# Patient Record
Sex: Male | Born: 1970 | Race: White | Hispanic: No | Marital: Married | Smoking: Former smoker
Health system: Southern US, Community
[De-identification: ages and names within clinical notes are randomized; demographics above are authoritative.]

## PROBLEM LIST (undated history)

## (undated) DIAGNOSIS — E119 Type 2 diabetes mellitus without complications: Secondary | ICD-10-CM

## (undated) DIAGNOSIS — I10 Essential (primary) hypertension: Secondary | ICD-10-CM

## (undated) DIAGNOSIS — R319 Hematuria, unspecified: Secondary | ICD-10-CM

## (undated) HISTORY — DX: Hematuria, unspecified: R31.9

## (undated) HISTORY — DX: Type 2 diabetes mellitus without complications: E11.9

## (undated) HISTORY — DX: Essential (primary) hypertension: I10

## (undated) HISTORY — PX: VASECTOMY: SHX75

---

## 2010-06-26 ENCOUNTER — Emergency Department: Payer: Self-pay | Admitting: Emergency Medicine

## 2019-03-25 ENCOUNTER — Emergency Department
Admission: EM | Admit: 2019-03-25 | Discharge: 2019-03-25 | Disposition: A | Discharge status: Home / Self Care | Payer: Self-pay | Attending: Emergency Medicine | Admitting: Emergency Medicine

## 2019-03-25 ENCOUNTER — Emergency Department: Payer: Self-pay

## 2019-03-25 ENCOUNTER — Encounter: Payer: Self-pay | Admitting: Intensive Care

## 2019-03-25 ENCOUNTER — Other Ambulatory Visit: Payer: Self-pay

## 2019-03-25 DIAGNOSIS — Z87891 Personal history of nicotine dependence: Secondary | ICD-10-CM | POA: Insufficient documentation

## 2019-03-25 DIAGNOSIS — E119 Type 2 diabetes mellitus without complications: Secondary | ICD-10-CM | POA: Insufficient documentation

## 2019-03-25 DIAGNOSIS — R519 Headache, unspecified: Secondary | ICD-10-CM

## 2019-03-25 DIAGNOSIS — I10 Essential (primary) hypertension: Secondary | ICD-10-CM | POA: Insufficient documentation

## 2019-03-25 DIAGNOSIS — R51 Headache: Secondary | ICD-10-CM | POA: Insufficient documentation

## 2019-03-25 LAB — CBC WITH DIFFERENTIAL/PLATELET
Abs Immature Granulocytes: 0.02 10*3/uL (ref 0.00–0.07)
Basophils Absolute: 0.1 10*3/uL (ref 0.0–0.1)
Basophils Relative: 1 %
Eosinophils Absolute: 0.2 10*3/uL (ref 0.0–0.5)
Eosinophils Relative: 2 %
HCT: 47.6 % (ref 39.0–52.0)
Hemoglobin: 16.3 g/dL (ref 13.0–17.0)
Immature Granulocytes: 0 %
Lymphocytes Relative: 29 %
Lymphs Abs: 1.9 10*3/uL (ref 0.7–4.0)
MCH: 29.7 pg (ref 26.0–34.0)
MCHC: 34.2 g/dL (ref 30.0–36.0)
MCV: 86.9 fL (ref 80.0–100.0)
Monocytes Absolute: 0.6 10*3/uL (ref 0.1–1.0)
Monocytes Relative: 10 %
Neutro Abs: 3.7 10*3/uL (ref 1.7–7.7)
Neutrophils Relative %: 58 %
Platelets: 255 10*3/uL (ref 150–400)
RBC: 5.48 MIL/uL (ref 4.22–5.81)
RDW: 11.9 % (ref 11.5–15.5)
WBC: 6.4 10*3/uL (ref 4.0–10.5)
nRBC: 0 % (ref 0.0–0.2)

## 2019-03-25 LAB — COMPREHENSIVE METABOLIC PANEL
ALT: 22 U/L (ref 0–44)
AST: 20 U/L (ref 15–41)
Albumin: 4.2 g/dL (ref 3.5–5.0)
Alkaline Phosphatase: 88 U/L (ref 38–126)
Anion gap: 12 (ref 5–15)
BUN: 22 mg/dL — ABNORMAL HIGH (ref 6–20)
CO2: 23 mmol/L (ref 22–32)
Calcium: 9 mg/dL (ref 8.9–10.3)
Chloride: 100 mmol/L (ref 98–111)
Creatinine, Ser: 1.05 mg/dL (ref 0.61–1.24)
GFR calc Af Amer: >60 mL/min (ref >60)
GFR calc non Af Amer: >60 mL/min (ref >60)
Glucose, Bld: 199 mg/dL — ABNORMAL HIGH (ref 70–99)
Potassium: 3.2 mmol/L — ABNORMAL LOW (ref 3.5–5.1)
Sodium: 135 mmol/L (ref 135–145)
Total Bilirubin: 0.7 mg/dL (ref 0.3–1.2)
Total Protein: 7.2 g/dL (ref 6.5–8.1)

## 2019-03-25 MED ORDER — AMLODIPINE BESYLATE 5 MG PO TABS: 5.0000 mg | ORAL_TABLET | Freq: Every day | ORAL | 1 refills | Status: DC

## 2019-03-25 MED ORDER — ONDANSETRON 4 MG PO TBDP: 4.0000 mg | ORAL_TABLET | Freq: Three times a day (TID) | ORAL | 0 refills | Status: DC | PRN

## 2019-03-25 MED ORDER — METFORMIN HCL 500 MG PO TABS: 500.0000 mg | ORAL_TABLET | Freq: Two times a day (BID) | ORAL | 0 refills | Status: DC

## 2019-03-25 MED ORDER — IOHEXOL 350 MG/ML SOLN
75.0000 mL | Freq: Once | INTRAVENOUS | Status: AC | PRN
  Administered 2019-03-25: 75 mL via INTRAVENOUS

## 2019-03-25 NOTE — ED Provider Notes (Signed)
[facility] Emergency Department Provider Note  ____________________________________________  Time seen: Approximately 10:52 PM  I have reviewed the triage vital signs and the nursing notes.   HISTORY  Chief Complaint Migraine and Nausea    HPI ATARI NOVICK is a 48 y.o. male with no known past medical history who complains of a recurrent daily headache over the past 6 weeks.  Feels it behind the right eye radiating to the area of the right ear.  No aggravating or alleviating factors.  Has not noticed a pattern to timing or position or other activity.  Denies vision changes, hearing changes, paresthesias or weakness.  No trauma.  No fevers chills or neck pain.  Occurs approximately 3 times a day for which he takes Tylenol with some relief.    Of note patient has not eaten since breakfast, approximately 10 hours ago  History reviewed. No pertinent past medical history.   There are no active problems to display for this patient.    History reviewed. No pertinent surgical history.   Prior to Admission medications   Medication Sig Start Date End Date Taking? Authorizing Provider  amLODipine (NORVASC) 5 MG tablet Take 1 tablet (5 mg total) by mouth daily. 03/25/19   Sharman Cheek, MD  metFORMIN (GLUCOPHAGE) 500 MG tablet Take 1 tablet (500 mg total) by mouth 2 (two) times daily with a meal. 03/25/19   Sharman Cheek, MD  ondansetron (ZOFRAN ODT) 4 MG disintegrating tablet Take 1 tablet (4 mg total) by mouth every 8 (eight) hours as needed for nausea or vomiting. 03/25/19   Sharman Cheek, MD  Prescribed in ED   Allergies Patient has no known allergies.   History reviewed. No pertinent family history.  Social History Social History   Tobacco Use  . Smoking status: Former Smoker    Types: E-cigarettes, Cigarettes  . Smokeless tobacco: Never Used  Substance Use Topics  . Alcohol use: Yes  . Drug use: Never    Review of  Systems  Constitutional:   No fever or chills.  ENT:   No sore throat. No rhinorrhea. Cardiovascular:   No chest pain or syncope. Respiratory:   No dyspnea or cough. Gastrointestinal:   Negative for abdominal pain, vomiting and diarrhea.  Musculoskeletal:   Negative for focal pain or swelling All other systems reviewed and are negative except as documented above in ROS and HPI.  ____________________________________________   PHYSICAL EXAM:  VITAL SIGNS: ED Triage Vitals  Enc Vitals Group     BP 03/25/19 1738 (!) 169/110     Pulse Rate 03/25/19 1738 64     Resp 03/25/19 1738 18     Temp 03/25/19 1738 98.2 F (36.8 C)     Temp Source 03/25/19 1738 Oral     SpO2 03/25/19 1738 100 %     Weight 03/25/19 1739 185 lb (83.9 kg)     Height 03/25/19 1739 5\' 6"  (1.676 m)     Head Circumference --      Peak Flow --      Pain Score 03/25/19 1743 2     Pain Loc --      Pain Edu? --      Excl. in GC? --     Vital signs reviewed, nursing assessments reviewed.   Constitutional:   Alert and oriented. Non-toxic appearance. Eyes:   Conjunctivae are normal. EOMI. PERRL.  No APD, no nystagmus ENT      Head:   Normocephalic and atraumatic.  Nose:   Wearing a mask.      Mouth/Throat:   Wearing a mask.      Neck:   No meningismus. Full ROM. Hematological/Lymphatic/Immunilogical:   No cervical lymphadenopathy. Cardiovascular:   RRR. Symmetric bilateral radial and DP pulses.  No murmurs. Cap refill less than 2 seconds. Respiratory:   Normal respiratory effort without tachypnea/retractions. Breath sounds are clear and equal bilaterally. No wheezes/rales/rhonchi. Gastrointestinal:   Soft and nontender. Non distended. There is no CVA tenderness.  No rebound, rigidity, or guarding.  Musculoskeletal:   Normal range of motion in all extremities. No joint effusions.  No lower extremity tenderness.  No edema. Neurologic:   Normal speech and language.  Cranial nerves III through XII  intact Normal gait Normal cerebellar function Motor grossly intact. No acute focal neurologic deficits are appreciated.  Skin:    Skin is warm, dry and intact. No rash noted.  No petechiae, purpura, or bullae.  ____________________________________________    LABS (pertinent positives/negatives) (all labs ordered are listed, but only abnormal results are displayed) Labs Reviewed  COMPREHENSIVE METABOLIC PANEL - Abnormal; Notable for the following components:      Result Value   Potassium 3.2 (*)    Glucose, Bld 199 (*)    BUN 22 (*)    All other components within normal limits  CBC WITH DIFFERENTIAL/PLATELET  HEMOGLOBIN A1C   ____________________________________________   EKG    ____________________________________________    RADIOLOGY  Ct Angio Head W Or Wo Contrast  Result Date: 03/25/2019 CLINICAL DATA:  Headache for the last 6 weeks. EXAM: CT ANGIOGRAPHY HEAD TECHNIQUE: Multidetector CT imaging of the head was performed using the standard protocol during bolus administration of intravenous contrast. Multiplanar CT image reconstructions and MIPs were obtained to evaluate the vascular anatomy. CONTRAST:  6mL OMNIPAQUE IOHEXOL 350 MG/ML SOLN COMPARISON:  None. FINDINGS: CT HEAD Brain: The brain shows a normal appearance without evidence of malformation, atrophy, old or acute small or large vessel infarction, mass lesion, hemorrhage, hydrocephalus or extra-axial collection. Vascular: No hyperdense vessel. No evidence of atherosclerotic calcification. Skull: Normal.  No traumatic finding.  No focal bone lesion. Sinuses/Orbits: Patient does have some inflammatory change of the ethmoid and frontal sinuses right more than left. This is not advanced. Orbits negative. Other: None significant CTA HEAD Anterior circulation: Both internal carotid arteries widely patent through the skull base and siphon regions. The anterior and middle cerebral vessels are normal without aneurysm, stenosis  or vascular malformation. Posterior circulation: Both vertebral arteries are patent through the foramen magnum to the basilar. No basilar stenosis. Posterior circulation branch vessels are normal. PCAs receive most of there supply from the anterior circulation. Venous sinuses: Patent and normal. Anatomic variants: None significant. IMPRESSION: Normal head CT. Normal intracranial CT angiography. No abnormality seen to explain headache. Electronically Signed   By: Nelson Chimes M.D.   On: 03/25/2019 20:37    ____________________________________________   PROCEDURES Procedures  ____________________________________________  DIFFERENTIAL DIAGNOSIS   Intracranial tumor, cerebral aneurysm, chronic daily headache, secondary headache due to hypertension  CLINICAL IMPRESSION / ASSESSMENT AND PLAN / ED COURSE  Medications ordered in the ED: Medications  iohexol (OMNIPAQUE) 350 MG/ML injection 75 mL (75 mLs Intravenous Contrast Given 03/25/19 2023)    Pertinent labs & imaging results that were available during my care of the patient were reviewed by me and considered in my medical decision making (see chart for details).  KAREE CHRISTOPHERSON was evaluated in Emergency Department on 03/25/2019 for the symptoms  described in the history of present illness. He was evaluated in the context of the global COVID-19 pandemic, which necessitated consideration that the patient might be at risk for infection with the SARS-CoV-2 virus that causes COVID-19. Institutional protocols and algorithms that pertain to the evaluation of patients at risk for COVID-19 are in a state of rapid change based on information released by regulatory bodies including the CDC and federal and state organizations. These policies and algorithms were followed during the patient's care in the ED.   Patient presents with chronic daily headache, unusual that it is unilateral.  Due to the chronicity, will get imaging out of concern for tumor or  aneurysm.  Clinical Course as of Mar 25 2251  Fri Mar 25, 2019  2057 CT angiogram of the head is normal, no findings to explain the patient's symptoms.  May be related to medication overuse.  Will refer to neurology.   [PS]    Clinical Course User Index [PS] Sharman CheekStafford, Eyvette Cordon, MD    ----------------------------------------- 10:54 PM on 03/25/2019 -----------------------------------------  Persistently elevated blood pressure in the ED, maintaining a level of about 170/100.  I will start him on a low-dose amlodipine for this.  Also with his fasting glucose level of 199 today, this is diagnostic of a new onset of diabetes and I will start him on metformin.  Recommend close follow-up with primary care in about a week.  Prescriptions for amlodipine and metformin.   ____________________________________________   FINAL CLINICAL IMPRESSION(S) / ED DIAGNOSES    Final diagnoses:  New onset type 2 diabetes mellitus (HCC)  Hypertension, unspecified type  Chronic daily headache     ED Discharge Orders         Ordered    metFORMIN (GLUCOPHAGE) 500 MG tablet  2 times daily with meals     03/25/19 2251    amLODipine (NORVASC) 5 MG tablet  Daily     03/25/19 2251    ondansetron (ZOFRAN ODT) 4 MG disintegrating tablet  Every 8 hours PRN     03/25/19 2251          Portions of this note were generated with dragon dictation software. Dictation errors may occur despite best attempts at proofreading.   Sharman CheekStafford, Parry Po, MD 03/25/19 2255

## 2019-03-25 NOTE — ED Notes (Signed)
Called lab and spoke with Murray Hodgkins and she will add on A1C to blood already in lab

## 2019-03-25 NOTE — ED Triage Notes (Addendum)
Patient reports he has had migraine X 6 weeks with nausea. [facility] sent here for CT of head. Denies HX migraines. Reports migraine is not tolerable without tylenol. Denies blurry vision

## 2019-03-25 NOTE — ED Notes (Signed)
First Nurse Note: Pt states that he was seen at [facility] and was told that he needed to have a CT scan. Pt is c/o that goes from his right eye all the way back to his right ear. Pt states that he has been on antibiotics for 3 days.

## 2019-03-25 NOTE — ED Notes (Signed)
Patient transported to CT 

## 2019-03-28 LAB — HEMOGLOBIN A1C
Hgb A1c MFr Bld: 7.5 % — ABNORMAL HIGH (ref 4.8–5.6)
Mean Plasma Glucose: 169 mg/dL

## 2019-07-04 ENCOUNTER — Ambulatory Visit: Payer: Self-pay | Attending: Internal Medicine

## 2019-07-04 DIAGNOSIS — Z20822 Contact with and (suspected) exposure to covid-19: Secondary | ICD-10-CM

## 2019-07-04 DIAGNOSIS — U071 COVID-19: Secondary | ICD-10-CM | POA: Insufficient documentation

## 2019-07-06 LAB — NOVEL CORONAVIRUS, NAA: SARS-CoV-2, NAA: DETECTED — AB

## 2019-09-24 ENCOUNTER — Emergency Department: Payer: Self-pay

## 2019-09-24 ENCOUNTER — Other Ambulatory Visit: Payer: Self-pay

## 2019-09-24 ENCOUNTER — Emergency Department
Admission: EM | Admit: 2019-09-24 | Discharge: 2019-09-24 | Disposition: A | Discharge status: Home / Self Care | Payer: Self-pay | Attending: Student | Admitting: Student

## 2019-09-24 DIAGNOSIS — K047 Periapical abscess without sinus: Secondary | ICD-10-CM | POA: Insufficient documentation

## 2019-09-24 DIAGNOSIS — Z79899 Other long term (current) drug therapy: Secondary | ICD-10-CM | POA: Insufficient documentation

## 2019-09-24 DIAGNOSIS — L03211 Cellulitis of face: Secondary | ICD-10-CM | POA: Insufficient documentation

## 2019-09-24 DIAGNOSIS — Z87891 Personal history of nicotine dependence: Secondary | ICD-10-CM | POA: Insufficient documentation

## 2019-09-24 LAB — CBC WITH DIFFERENTIAL/PLATELET
Abs Immature Granulocytes: 0.03 10*3/uL (ref 0.00–0.07)
Basophils Absolute: 0.1 10*3/uL (ref 0.0–0.1)
Basophils Relative: 1 %
Eosinophils Absolute: 0.1 10*3/uL (ref 0.0–0.5)
Eosinophils Relative: 1 %
HCT: 49.5 % (ref 39.0–52.0)
Hemoglobin: 16.7 g/dL (ref 13.0–17.0)
Immature Granulocytes: 0 %
Lymphocytes Relative: 21 %
Lymphs Abs: 1.8 10*3/uL (ref 0.7–4.0)
MCH: 29.6 pg (ref 26.0–34.0)
MCHC: 33.7 g/dL (ref 30.0–36.0)
MCV: 87.8 fL (ref 80.0–100.0)
Monocytes Absolute: 1 10*3/uL (ref 0.1–1.0)
Monocytes Relative: 12 %
Neutro Abs: 5.5 10*3/uL (ref 1.7–7.7)
Neutrophils Relative %: 65 %
Platelets: 245 10*3/uL (ref 150–400)
RBC: 5.64 MIL/uL (ref 4.22–5.81)
RDW: 12 % (ref 11.5–15.5)
WBC: 8.5 10*3/uL (ref 4.0–10.5)
nRBC: 0 % (ref 0.0–0.2)

## 2019-09-24 LAB — COMPREHENSIVE METABOLIC PANEL
ALT: 26 U/L (ref 0–44)
AST: 24 U/L (ref 15–41)
Albumin: 4.4 g/dL (ref 3.5–5.0)
Alkaline Phosphatase: 126 U/L (ref 38–126)
Anion gap: 9 (ref 5–15)
BUN: 16 mg/dL (ref 6–20)
CO2: 27 mmol/L (ref 22–32)
Calcium: 9.1 mg/dL (ref 8.9–10.3)
Chloride: 104 mmol/L (ref 98–111)
Creatinine, Ser: 1.12 mg/dL (ref 0.61–1.24)
GFR calc Af Amer: >60 mL/min (ref >60)
GFR calc non Af Amer: >60 mL/min (ref >60)
Glucose, Bld: 129 mg/dL — ABNORMAL HIGH (ref 70–99)
Potassium: 4.4 mmol/L (ref 3.5–5.1)
Sodium: 140 mmol/L (ref 135–145)
Total Bilirubin: 0.7 mg/dL (ref 0.3–1.2)
Total Protein: 7.7 g/dL (ref 6.5–8.1)

## 2019-09-24 LAB — TROPONIN I (HIGH SENSITIVITY): Troponin I (High Sensitivity): 6 ng/L (ref <18)

## 2019-09-24 LAB — LACTIC ACID, PLASMA: Lactic Acid, Venous: 1.7 mmol/L (ref 0.5–1.9)

## 2019-09-24 IMAGING — CT CT NECK W/ CM
3 of 4 series · 11 of 33 positions shown, 13 images · IV contrast (omnipaque)
Comparison: None.

CLINICAL DATA: Right-sided ear, throat, and submandibular pain with
palpable abnormality in the right submandibular region.

EXAM:
CT NECK WITH CONTRAST
TECHNIQUE: Multidetector CT imaging of the neck was performed using the
standard protocol following the bolus administration of intravenous
contrast.
CONTRAST:  75mL OMNIPAQUE IOHEXOL 300 MG/ML  SOLN

[Series 5: sag neck · sagittal · 0.43mm/px · 5 of 87 slices shown, 6 images]
[im 29/87  bone]
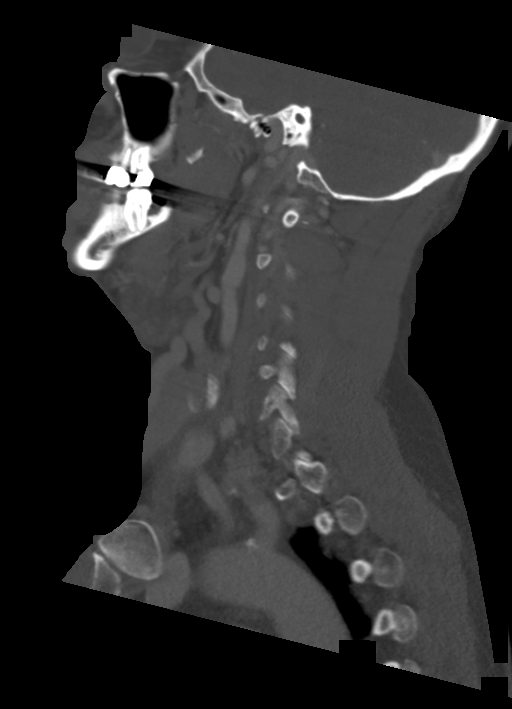
[im 36/87  bone]
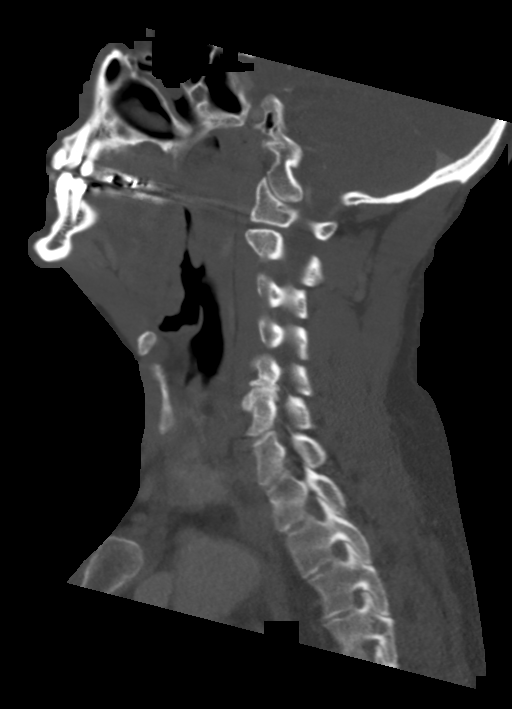
[im 44/87  soft-tissue]
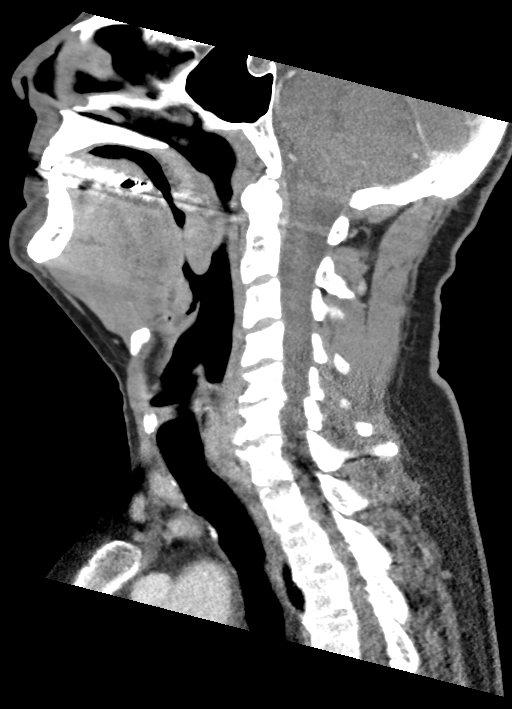
[im 44/87  bone]
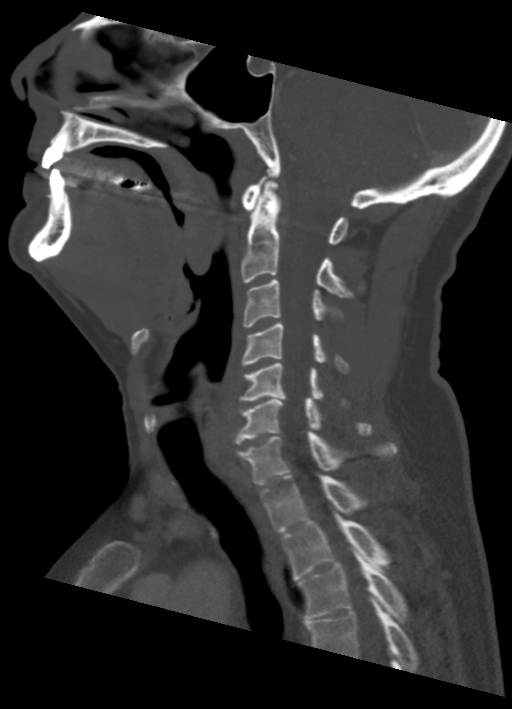
[im 51/87  bone]
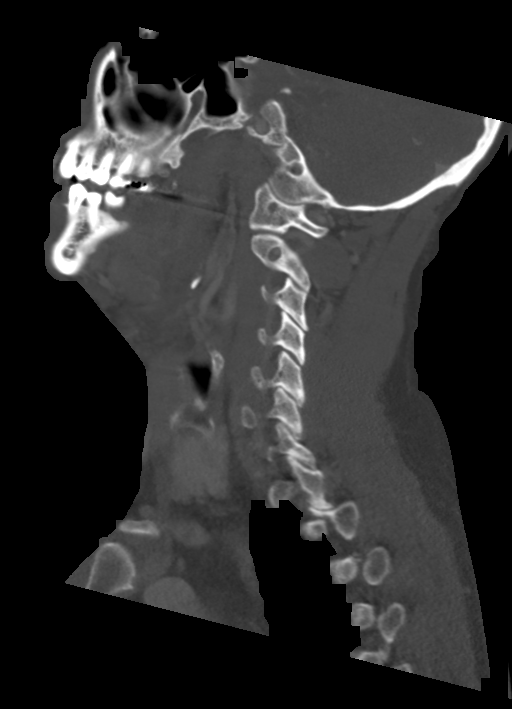
[im 58/87  bone]
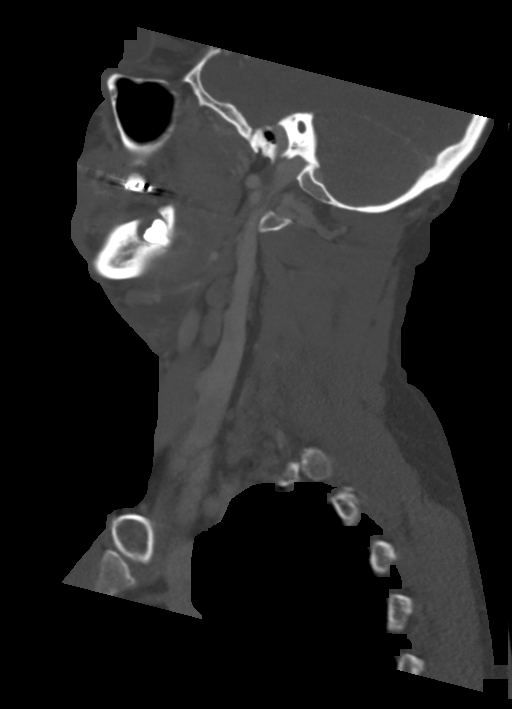

[Series 6: cor neck · coronal · 0.34mm/px · 3 of 109 slices shown]
[im 25/109  bone]
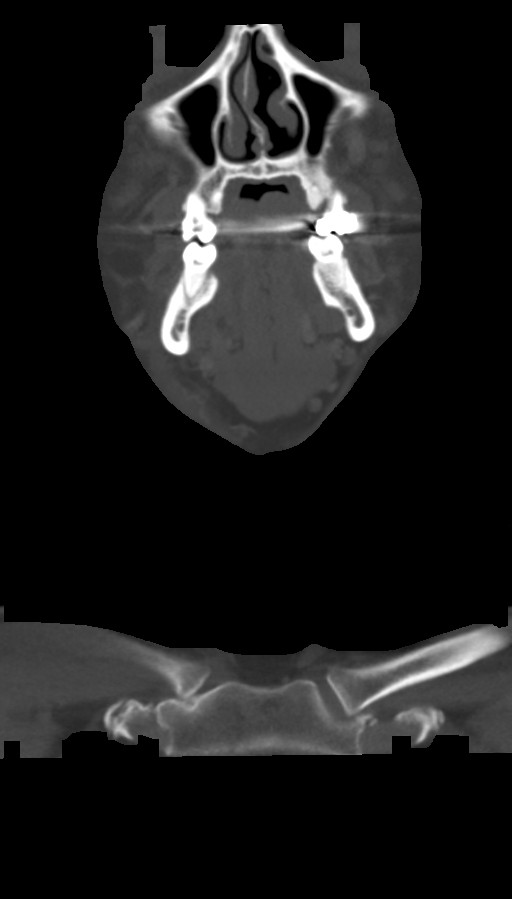
[im 45/109  bone]
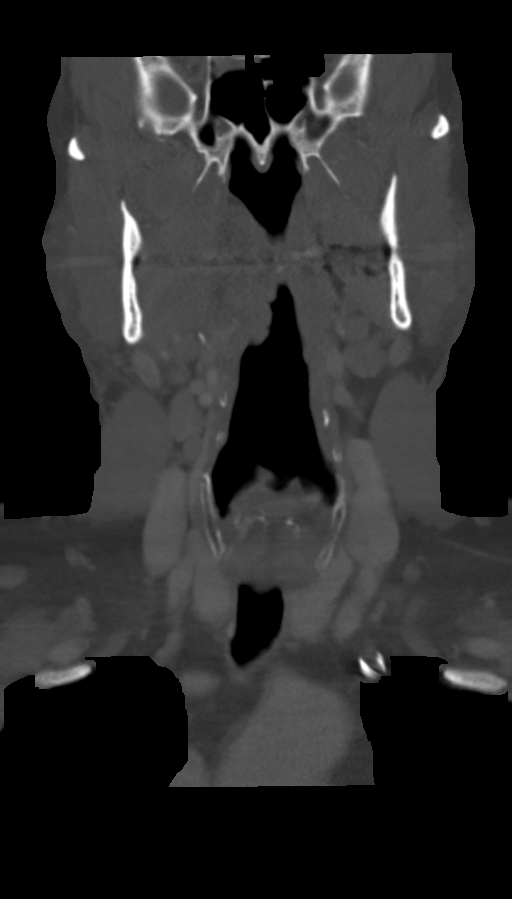
[im 64/109  bone]
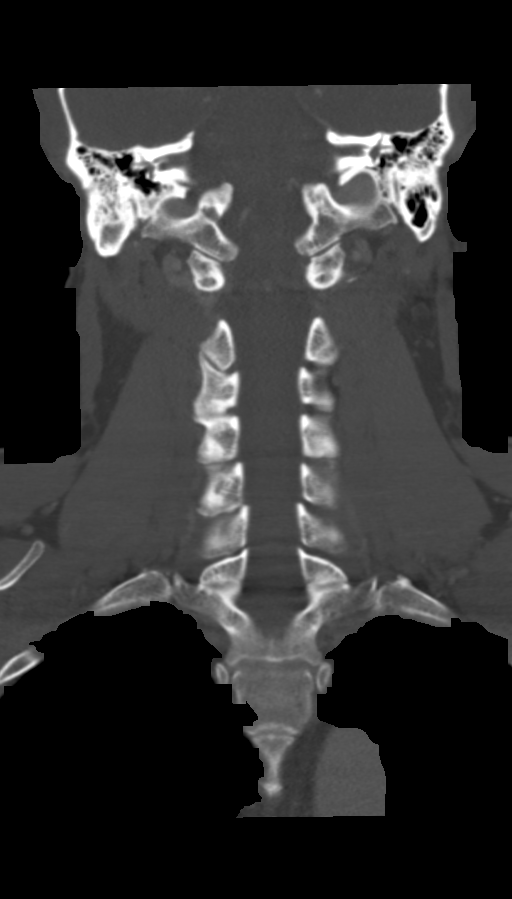

[Series 7: orthogonal ax · axial · 0.34mm/px · z∈[-303,-139]mm · 3 of 150 slices shown, 4 images]
[im 43/150  soft-tissue]
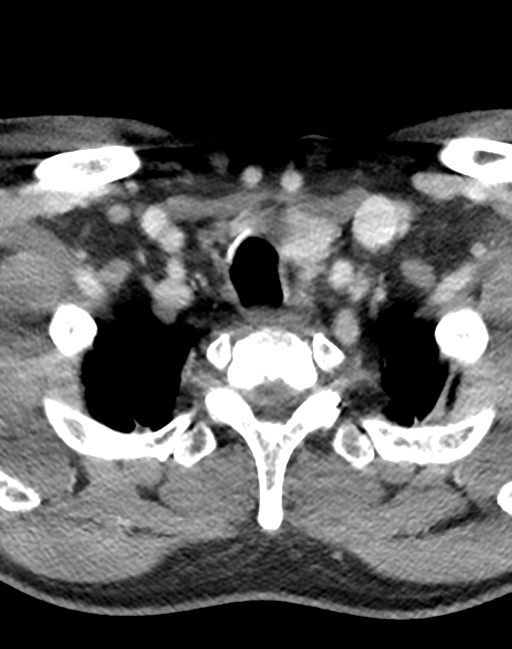
[im 43/150  bone]
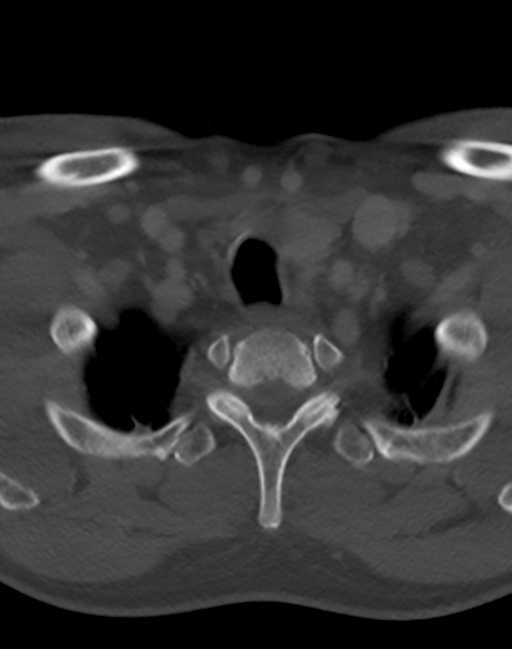
[im 86/150  bone]
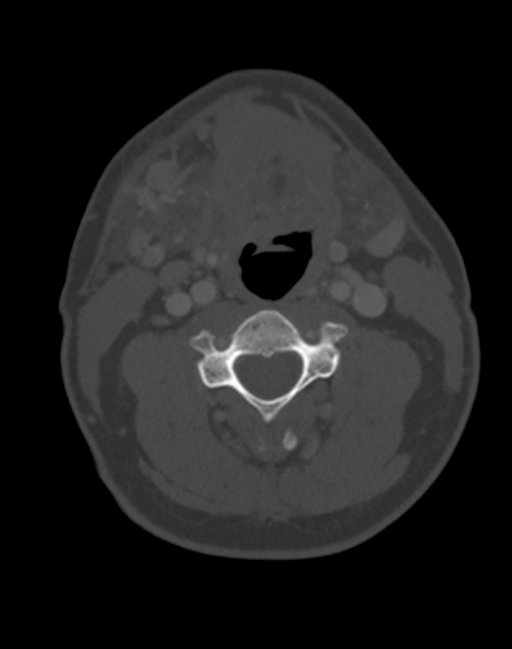
[im 128/150  bone]
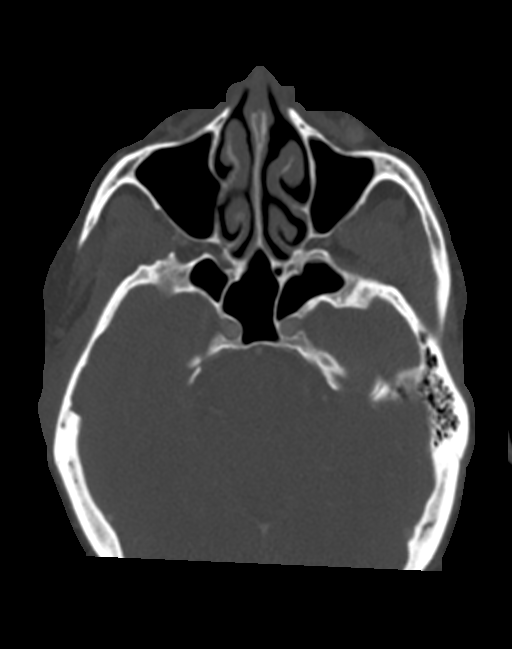

[11 of 33 positions shown; findings below may reference images not displayed]

FINDINGS: Pharynx and larynx: Mild right parapharyngeal space edema with mild
external mass effect on the right oropharynx due to the inflammatory
process described below. No significant airway narrowing. No
retropharyngeal fluid collection.

Salivary glands: There are inflammatory changes primarily
posteriorly in the right submandibular space with soft tissue
thickening extending anteriorly and inferiorly along the platysma as
well as inflammation extending posteriorly and superiorly into the
parapharyngeal space. Inflammation also involves the masticator
space with edema of the medial pterygoid muscle and to a lesser
extent masseter muscle. No organized fluid collection is identified.
There is fatty infiltration of both submandibular glands, and the
right submandibular gland does not clearly appear to be the
epicenter of the aforementioned inflammatory process. The parotid
glands are unremarkable. No salivary stone or ductal dilatation is
identified.

Thyroid: Unremarkable.

Lymph nodes: Mildly prominent subcentimeter short axis lymph nodes
on the right levels IB and IIA, likely reactive.

Vascular: Major vascular structures of the neck are patent.

Limited intracranial: Unremarkable.

Visualized orbits: Unremarkable.

Mastoids and visualized paranasal sinuses: Mild scattered mucosal
thickening in the visualized paranasal sinuses. No significant
mastoid fluid.

Skeleton: Dental disease with scattered periapical lucencies. More
extensive lucency surrounding the unerupted right mandibular third
maxillary molar with overlying cortical disruption and with the
greatest amount of the above described soft tissue inflammation
located in this region. Mild cervical spondylosis.

Upper chest: Clear lung apices.

Other: None.
IMPRESSION: Inflammation in the right submandibular and masticator spaces likely
reflecting cellulitis and favored to be dental in origin arising
from the right third mandibular molar tooth. No drainable abscess.

## 2019-09-24 MED ORDER — CLINDAMYCIN HCL 300 MG PO CAPS: 300.0000 mg | ORAL_CAPSULE | Freq: Four times a day (QID) | ORAL | 0 refills | Status: DC

## 2019-09-24 MED ORDER — IOHEXOL 300 MG/ML  SOLN
75.0000 mL | Freq: Once | INTRAMUSCULAR | Status: AC | PRN
  Administered 2019-09-24: 17:00:00 75 mL via INTRAVENOUS

## 2019-09-24 MED ORDER — HYDROCODONE-ACETAMINOPHEN 5-325 MG PO TABS: 1.0000 | ORAL_TABLET | ORAL | 0 refills | Status: DC | PRN

## 2019-09-24 MED ORDER — CLINDAMYCIN HCL 150 MG PO CAPS
300.0000 mg | ORAL_CAPSULE | Freq: Once | ORAL | Status: AC
  Administered 2019-09-24: 300 mg via ORAL
  Filled 2019-09-24: qty 2

## 2019-09-24 MED ORDER — OXYCODONE-ACETAMINOPHEN 5-325 MG PO TABS: 1.0000 | ORAL_TABLET | Freq: Once | ORAL | Status: DC

## 2019-09-24 NOTE — ED Triage Notes (Signed)
Pt states that pt comes for ear pain and not able to open mouth/jaw pain for 2 days. Seen at [facility] and didn't dx pt with anything. Sent pt here for more work up. Pt was hypertensive at [facility]. Pt states it also hurts to swallow.

## 2019-09-24 NOTE — ED Notes (Signed)
Patient resting on stretcher, warm blanket given. NAD.

## 2019-09-24 NOTE — ED Provider Notes (Signed)
[facility] Emergency Department Provider Note  ____________________________________________  Time seen: Approximately 5:33 PM  I have reviewed the triage vital signs and the nursing notes.   HISTORY  Chief Complaint Otalgia    HPI RIGGS DINEEN is a 49 y.o. male who presents the emergency department complaining of pain to the right ear, right jaw, right throat.  Patient states that pain initiated around the right submandibular region at the angle of the jaw, has now encompassed both his ear and his throat.  Patient states that he has no difficulty swallowing but he does have difficulty opening his mouth due to the pain.  Patient denies any fevers or chills, nasal congestion.  Patient denies any difficulty breathing.  No cough or shortness of breath.  No abdominal pain, nausea vomiting or diarrhea.  Patient denies any trauma to the face.  No history of same in the past.  Patient has been using over-the-counter medications with no relief.         History reviewed. No pertinent past medical history.  There are no problems to display for this patient.   History reviewed. No pertinent surgical history.  Prior to Admission medications   Medication Sig Start Date End Date Taking? Authorizing Provider  amLODipine (NORVASC) 5 MG tablet Take 1 tablet (5 mg total) by mouth daily. 03/25/19   Sharman Cheek, MD  clindamycin (CLEOCIN) 300 MG capsule Take 1 capsule (300 mg total) by mouth 4 (four) times daily. 09/24/19   Cuthriell, Delorise Royals, PA-C  HYDROcodone-acetaminophen (NORCO/VICODIN) 5-325 MG tablet Take 1 tablet by mouth every 4 (four) hours as needed for moderate pain. 09/24/19   Cuthriell, Delorise Royals, PA-C  metFORMIN (GLUCOPHAGE) 500 MG tablet Take 1 tablet (500 mg total) by mouth 2 (two) times daily with a meal. 03/25/19   Sharman Cheek, MD  ondansetron (ZOFRAN ODT) 4 MG disintegrating tablet Take 1 tablet (4 mg total) by mouth every 8 (eight) hours as needed  for nausea or vomiting. 03/25/19   Sharman Cheek, MD    Allergies Patient has no known allergies.  History reviewed. No pertinent family history.  Social History Social History   Tobacco Use  . Smoking status: Former Smoker    Types: E-cigarettes, Cigarettes  . Smokeless tobacco: Never Used  Substance Use Topics  . Alcohol use: Yes  . Drug use: Never     Review of Systems  Constitutional: No fever/chills Eyes: No visual changes. No discharge ENT: Positive for right ear, right jaw, right throat pain Cardiovascular: no chest pain. Respiratory: no cough. No SOB. Gastrointestinal: No abdominal pain.  No nausea, no vomiting.  No diarrhea.  No constipation. Musculoskeletal: Negative for musculoskeletal pain. Skin: Negative for rash, abrasions, lacerations, ecchymosis. Neurological: Negative for headaches, focal weakness or numbness. 10-point ROS otherwise negative.  ____________________________________________   PHYSICAL EXAM:  VITAL SIGNS: ED Triage Vitals  Enc Vitals Group     BP 09/24/19 1531 (!) 199/106     Pulse Rate 09/24/19 1531 62     Resp 09/24/19 1531 18     Temp 09/24/19 1531 98.4 F (36.9 C)     Temp Source 09/24/19 1531 Oral     SpO2 09/24/19 1531 99 %     Weight 09/24/19 1533 183 lb (83 kg)     Height 09/24/19 1533 5\' 6"  (1.676 m)     Head Circumference --      Peak Flow --      Pain Score 09/24/19 1533 8  Pain Loc --      Pain Edu? --      Excl. in GC? --      Constitutional: Alert and oriented. Well appearing and in no acute distress. Eyes: Conjunctivae are normal. PERRL. EOMI. Head: Atraumatic. ENT:      Ears: EACs and TMs unremarkable bilaterally.        Nose: No congestion/rhinnorhea.      Mouth/Throat: Visualization of the external jaw reveals mild edema in the submandibular region around the angle of the jaw.  No extension into the submental region.  No erythema or edema of the anterior neck.  Mucous membranes are moist. Patient  has mild trismus due to pain, what is visualized of the oropharynx reveals no erythema or edema.  Tonsils are not well visualized bilaterally.  Palpation along the right submandibular region at the angle of the jaw reveals tenderness, palpable finding concerning for sialoadenitis.  No significant tenderness over the parotid gland.  No submental tenderness.  No evidence of lymphadenopathy on palpation along the anterior cervical chain.  No gross erythema or edema of the anterior neck. Neck: No stridor.  No cervical spine tenderness to palpation. Hematological/Lymphatic/Immunilogical: No cervical lymphadenopathy. Cardiovascular: Normal rate, regular rhythm. Normal S1 and S2.  Good peripheral circulation. Respiratory: Normal respiratory effort without tachypnea or retractions. Lungs CTAB. Good air entry to the bases with no decreased or absent breath sounds. Musculoskeletal: Full range of motion to all extremities. No gross deformities appreciated. Neurologic:  Normal speech and language. No gross focal neurologic deficits are appreciated.  Skin:  Skin is warm, dry and intact. No rash noted. Psychiatric: Mood and affect are normal. Speech and behavior are normal. Patient exhibits appropriate insight and judgement.   ____________________________________________   LABS (all labs ordered are listed, but only abnormal results are displayed)  Labs Reviewed  COMPREHENSIVE METABOLIC PANEL - Abnormal; Notable for the following components:      Result Value   Glucose, Bld 129 (*)    All other components within normal limits  LACTIC ACID, PLASMA  CBC WITH DIFFERENTIAL/PLATELET  TROPONIN I (HIGH SENSITIVITY)   ____________________________________________  EKG   ____________________________________________  RADIOLOGY I personally viewed and evaluated these images as part of my medical decision making, as well as reviewing the written report by the radiologist.  CT Soft Tissue Neck W  Contrast  Result Date: 09/24/2019 CLINICAL DATA:  Right-sided ear, throat, and submandibular pain with palpable abnormality in the right submandibular region. EXAM: CT NECK WITH CONTRAST TECHNIQUE: Multidetector CT imaging of the neck was performed using the standard protocol following the bolus administration of intravenous contrast. CONTRAST:  3mL OMNIPAQUE IOHEXOL 300 MG/ML  SOLN COMPARISON:  None. FINDINGS: Pharynx and larynx: Mild right parapharyngeal space edema with mild external mass effect on the right oropharynx due to the inflammatory process described below. No significant airway narrowing. No retropharyngeal fluid collection. Salivary glands: There are inflammatory changes primarily posteriorly in the right submandibular space with soft tissue thickening extending anteriorly and inferiorly along the platysma as well as inflammation extending posteriorly and superiorly into the parapharyngeal space. Inflammation also involves the masticator space with edema of the medial pterygoid muscle and to a lesser extent masseter muscle. No organized fluid collection is identified. There is fatty infiltration of both submandibular glands, and the right submandibular gland does not clearly appear to be the epicenter of the aforementioned inflammatory process. The parotid glands are unremarkable. No salivary stone or ductal dilatation is identified. Thyroid: Unremarkable. Lymph nodes: Mildly  prominent subcentimeter short axis lymph nodes on the right levels IB and IIA, likely reactive. Vascular: Major vascular structures of the neck are patent. Limited intracranial: Unremarkable. Visualized orbits: Unremarkable. Mastoids and visualized paranasal sinuses: Mild scattered mucosal thickening in the visualized paranasal sinuses. No significant mastoid fluid. Skeleton: Dental disease with scattered periapical lucencies. More extensive lucency surrounding the unerupted right mandibular third maxillary molar with  overlying cortical disruption and with the greatest amount of the above described soft tissue inflammation located in this region. Mild cervical spondylosis. Upper chest: Clear lung apices. Other: None. IMPRESSION: Inflammation in the right submandibular and masticator spaces likely reflecting cellulitis and favored to be dental in origin arising from the right third mandibular molar tooth. No drainable abscess. Electronically Signed   By: Logan Bores M.D.   On: 09/24/2019 17:29    ____________________________________________    PROCEDURES  Procedure(s) performed:    Procedures    Medications  clindamycin (CLEOCIN) capsule 300 mg (has no administration in time range)  oxyCODONE-acetaminophen (PERCOCET/ROXICET) 5-325 MG per tablet 1 tablet (has no administration in time range)  iohexol (OMNIPAQUE) 300 MG/ML solution 75 mL (75 mLs Intravenous Contrast Given 09/24/19 1657)     ____________________________________________   INITIAL IMPRESSION / ASSESSMENT AND PLAN / ED COURSE  Pertinent labs & imaging results that were available during my care of the patient were reviewed by me and considered in my medical decision making (see chart for details).  Review of the [place] CSRS was performed in accordance of the [place] prior to dispensing any controlled drugs.           Patient's diagnosis is consistent with dental infection with facial cellulitis.  Patient presented to the emergency department with pain along the right jaw extending into the ear and throat.  Visualization revealed no significant oropharyngeal erythema or edema but patient did have some trismus due to pain.  Findings on physical exam are concerning for possible region of inflammation and tenderness in the right submandibular region.  Differential included parotiditis, abscess, facial cellulitis, sialoadenitis, Ludwick's angina, Lemierre's.  Patient had labs, CT scan.  Findings were consistent with facial cellulitis originating  in a dental infection.  Patient will be placed on clindamycin, limited pain medication.  Follow-up with primary care dentist as needed.. Patient is given ED precautions to return to the ED for any worsening or new symptoms.     ____________________________________________  FINAL CLINICAL IMPRESSION(S) / ED DIAGNOSES  Final diagnoses:  Facial cellulitis  Dental infection      NEW MEDICATIONS STARTED DURING THIS VISIT:  ED Discharge Orders         Ordered    clindamycin (CLEOCIN) 300 MG capsule  4 times daily     09/24/19 1827    HYDROcodone-acetaminophen (NORCO/VICODIN) 5-325 MG tablet  Every 4 hours PRN     09/24/19 1827              This chart was dictated using voice recognition software/Dragon. Despite best efforts to proofread, errors can occur which can change the meaning. Any change was purely unintentional.    Darletta Moll, PA-C 09/24/19 1828    Lilia Pro., MD 09/25/19 [phone]

## 2019-11-18 ENCOUNTER — Other Ambulatory Visit: Payer: Self-pay

## 2019-11-21 ENCOUNTER — Telehealth: Payer: Self-pay | Admitting: Adult Health

## 2019-11-21 ENCOUNTER — Ambulatory Visit (INDEPENDENT_AMBULATORY_CARE_PROVIDER_SITE_OTHER): Payer: 59 | Admitting: Nurse Practitioner

## 2019-11-21 ENCOUNTER — Encounter: Payer: Self-pay | Admitting: Nurse Practitioner

## 2019-11-21 ENCOUNTER — Other Ambulatory Visit: Payer: Self-pay

## 2019-11-21 VITALS — BP 138/100 | HR 74 | Temp 97.4°F | Ht 66.0 in | Wt 177.0 lb

## 2019-11-21 DIAGNOSIS — Z0000 Encounter for general adult medical examination without abnormal findings: Secondary | ICD-10-CM

## 2019-11-21 DIAGNOSIS — M954 Acquired deformity of chest and rib: Secondary | ICD-10-CM | POA: Diagnosis not present

## 2019-11-21 DIAGNOSIS — R3121 Asymptomatic microscopic hematuria: Secondary | ICD-10-CM | POA: Diagnosis not present

## 2019-11-21 DIAGNOSIS — I10 Essential (primary) hypertension: Secondary | ICD-10-CM | POA: Diagnosis not present

## 2019-11-21 DIAGNOSIS — E119 Type 2 diabetes mellitus without complications: Secondary | ICD-10-CM | POA: Diagnosis not present

## 2019-11-21 LAB — CBC WITH DIFFERENTIAL/PLATELET
Basophils Absolute: 0.1 10*3/uL (ref 0.0–0.1)
Basophils Relative: 1 % (ref 0.0–3.0)
Eosinophils Absolute: 0.2 10*3/uL (ref 0.0–0.7)
Eosinophils Relative: 2.7 % (ref 0.0–5.0)
HCT: 46.6 % (ref 39.0–52.0)
Hemoglobin: 15.9 g/dL (ref 13.0–17.0)
Lymphocytes Relative: 30.6 % (ref 12.0–46.0)
Lymphs Abs: 1.7 10*3/uL (ref 0.7–4.0)
MCHC: 34.2 g/dL (ref 30.0–36.0)
MCV: 87.2 fl (ref 78.0–100.0)
Monocytes Absolute: 0.6 10*3/uL (ref 0.1–1.0)
Monocytes Relative: 10.6 % (ref 3.0–12.0)
Neutro Abs: 3.1 10*3/uL (ref 1.4–7.7)
Neutrophils Relative %: 55.1 % (ref 43.0–77.0)
Platelets: 246 10*3/uL (ref 150.0–400.0)
RBC: 5.34 Mil/uL (ref 4.22–5.81)
RDW: 13.5 % (ref 11.5–15.5)
WBC: 5.7 10*3/uL (ref 4.0–10.5)

## 2019-11-21 LAB — MICROALBUMIN / CREATININE URINE RATIO
Creatinine,U: 118.3 mg/dL
Microalb Creat Ratio: 2.8 mg/g (ref 0.0–30.0)
Microalb, Ur: 3.3 mg/dL — ABNORMAL HIGH (ref 0.0–1.9)

## 2019-11-21 LAB — LIPID PANEL
Cholesterol: 170 mg/dL (ref 0–200)
HDL: 39.4 mg/dL (ref >39.00)
LDL Cholesterol: 106 mg/dL — ABNORMAL HIGH (ref 0–99)
NonHDL: 131.01
Total CHOL/HDL Ratio: 4
Triglycerides: 125 mg/dL (ref 0.0–149.0)
VLDL: 25 mg/dL (ref 0.0–40.0)

## 2019-11-21 LAB — COMPREHENSIVE METABOLIC PANEL
ALT: 25 U/L (ref 0–53)
AST: 23 U/L (ref 0–37)
Albumin: 4.4 g/dL (ref 3.5–5.2)
Alkaline Phosphatase: 89 U/L (ref 39–117)
BUN: 23 mg/dL (ref 6–23)
CO2: 29 mEq/L (ref 19–32)
Calcium: 9.5 mg/dL (ref 8.4–10.5)
Chloride: 100 mEq/L (ref 96–112)
Creatinine, Ser: 1.13 mg/dL (ref 0.40–1.50)
GFR: 68.44 mL/min (ref >60.00)
Glucose, Bld: 137 mg/dL — ABNORMAL HIGH (ref 70–99)
Potassium: 3.9 mEq/L (ref 3.5–5.1)
Sodium: 138 mEq/L (ref 135–145)
Total Bilirubin: 0.7 mg/dL (ref 0.2–1.2)
Total Protein: 6.9 g/dL (ref 6.0–8.3)

## 2019-11-21 LAB — TSH: TSH: 1.73 u[IU]/mL (ref 0.35–4.50)

## 2019-11-21 LAB — URINALYSIS, ROUTINE W REFLEX MICROSCOPIC
Bilirubin Urine: NEGATIVE
Ketones, ur: NEGATIVE
Leukocytes,Ua: NEGATIVE
Nitrite: NEGATIVE
Specific Gravity, Urine: 1.025 (ref 1.000–1.030)
Total Protein, Urine: NEGATIVE
Urine Glucose: 250 — AB
Urobilinogen, UA: 0.2 (ref 0.0–1.0)
pH: 6 (ref 5.0–8.0)

## 2019-11-21 LAB — HEMOGLOBIN A1C: Hgb A1c MFr Bld: 7.7 % — ABNORMAL HIGH (ref 4.6–6.5)

## 2019-11-21 NOTE — Patient Instructions (Addendum)
It was nice to meet you.   Please go to the lab today.   I have referred you to the [facility].   I have referred you to ORTHOPEDICS because of your concern about bony prominence at breastbone.   Please follow up in 2 months with BP readings.  Also, check fasting blood sugar in the morning- before you eat or drink and check x3 times a week. Also, check 2 hours after supper x 1-2 times a week.    Diabetes Basics  Diabetes (diabetes mellitus) is a long-term (chronic) disease. It occurs when the body does not properly use sugar (glucose) that is released from food after you eat. Diabetes may be caused by one or both of these problems:  Your pancreas does not make enough of a hormone called insulin.  Your body does not react in a normal way to insulin that it makes. Insulin lets sugars (glucose) go into cells in your body. This gives you energy. If you have diabetes, sugars cannot get into cells. This causes high blood sugar (hyperglycemia). Follow these instructions at home: How is diabetes treated? You may need to take insulin or other diabetes medicines daily to keep your blood sugar in balance. Take your diabetes medicines every day as told by your doctor. List your diabetes medicines here: Diabetes medicines  Name of medicine: ______________________________ ? Amount (dose): _______________ Time (a.m./p.m.): _______________ Notes: ___________________________________  Name of medicine: ______________________________ ? Amount (dose): _______________ Time (a.m./p.m.): _______________ Notes: ___________________________________  Name of medicine: ______________________________ ? Amount (dose): _______________ Time (a.m./p.m.): _______________ Notes: ___________________________________ If you use insulin, you will learn how to give yourself insulin by injection. You may need to adjust the amount based on the food that you eat. List the types of insulin you use  here: Insulin  Insulin type: ______________________________ ? Amount (dose): _______________ Time (a.m./p.m.): _______________ Notes: ___________________________________  Insulin type: ______________________________ ? Amount (dose): _______________ Time (a.m./p.m.): _______________ Notes: ___________________________________  Insulin type: ______________________________ ? Amount (dose): _______________ Time (a.m./p.m.): _______________ Notes: ___________________________________  Insulin type: ______________________________ ? Amount (dose): _______________ Time (a.m./p.m.): _______________ Notes: ___________________________________  Insulin type: ______________________________ ? Amount (dose): _______________ Time (a.m./p.m.): _______________ Notes: ___________________________________ How do I manage my blood sugar?  Check your blood sugar levels using a blood glucose monitor as directed by your doctor. Your doctor will set treatment goals for you. Generally, you should have these blood sugar levels:  Before meals (preprandial): 80-130 mg/dL (4.4-7.2 mmol/L).  After meals (postprandial): below 180 mg/dL (10 mmol/L).  A1c level: less than 7%. Write down the times that you will check your blood sugar levels: Blood sugar checks  Time: _______________ Notes: ___________________________________  Time: _______________ Notes: ___________________________________  Time: _______________ Notes: ___________________________________  Time: _______________ Notes: ___________________________________  Time: _______________ Notes: ___________________________________  Time: _______________ Notes: ___________________________________  What do I need to know about low blood sugar? Low blood sugar is called hypoglycemia. This is when blood sugar is at or below 70 mg/dL (3.9 mmol/L). Symptoms may include:  Feeling: ? Hungry. ? Worried or nervous (anxious). ? Sweaty and  clammy. ? Confused. ? Dizzy. ? Sleepy. ? Sick to your stomach (nauseous).  Having: ? A fast heartbeat. ? A headache. ? A change in your vision. ? Tingling or no feeling (numbness) around the mouth, lips, or tongue. ? Jerky movements that you cannot control (seizure).  Having trouble with: ? Moving (coordination). ? Sleeping. ? Passing out (fainting). ? Getting upset easily (irritability). Treating low blood sugar To treat low blood  sugar, eat or drink something sugary right away. If you can think clearly and swallow safely, follow the 15:15 rule:  Take 15 grams of a fast-acting carb (carbohydrate). Talk with your doctor about how much you should take.  Some fast-acting carbs are: ? Sugar tablets (glucose pills). Take 3-4 glucose pills. ? 6-8 pieces of hard candy. ? 4-6 oz (120-150 mL) of fruit juice. ? 4-6 oz (120-150 mL) of regular (not diet) soda. ? 1 Tbsp (15 mL) honey or sugar.  Check your blood sugar 15 minutes after you take the carb.  If your blood sugar is still at or below 70 mg/dL (3.9 mmol/L), take 15 grams of a carb again.  If your blood sugar does not go above 70 mg/dL (3.9 mmol/L) after 3 tries, get help right away.  After your blood sugar goes back to normal, eat a meal or a snack within 1 hour. Treating very low blood sugar If your blood sugar is at or below 54 mg/dL (3 mmol/L), you have very low blood sugar (severe hypoglycemia). This is an emergency. Do not wait to see if the symptoms will go away. Get medical help right away. Call your local emergency services (911 in the [place] Do not drive yourself to the hospital. Questions to ask your health care provider  Do I need to meet with a diabetes educator?  What equipment will I need to care for myself at home?  What diabetes medicines do I need? When should I take them?  How often do I need to check my blood sugar?  What number can I call if I have questions?  When is my next doctor's  visit?  Where can I find a support group for people with diabetes? Where to find more information  American Diabetes Association: www.diabetes.org  American Association of Diabetes Educators: www.diabeteseducator.org/patient-resources Contact a doctor if:  Your blood sugar is at or above 240 mg/dL (71.6 mmol/L) for 2 days in a row.  You have been sick or have had a fever for 2 days or more, and you are not getting better.  You have any of these problems for more than 6 hours: ? You cannot eat or drink. ? You feel sick to your stomach (nauseous). ? You throw up (vomit). ? You have watery poop (diarrhea). Get help right away if:  Your blood sugar is lower than 54 mg/dL (3 mmol/L).  You get confused.  You have trouble: ? Thinking clearly. ? Breathing. Summary  Diabetes (diabetes mellitus) is a long-term (chronic) disease. It occurs when the body does not properly use sugar (glucose) that is released from food after digestion.  Take insulin and diabetes medicines as told.  Check your blood sugar every day, as often as told.  Keep all follow-up visits as told by your doctor. This is important. This information is not intended to replace advice given to you by your health care provider. Make sure you discuss any questions you have with your health care provider. Document Revised: 03/09/2019 Document Reviewed: 09/18/2017 Elsevier Patient Education  2020 [employer].  Hypertension, Adult High blood pressure (hypertension) is when the force of blood pumping through the arteries is too strong. The arteries are the blood vessels that carry blood from the heart throughout the body. Hypertension forces the heart to work harder to pump blood and may cause arteries to become narrow or stiff. Untreated or uncontrolled hypertension can cause a heart attack, heart failure, a stroke, kidney disease, and other problems. A  blood pressure reading consists of a higher number over a lower  number. Ideally, your blood pressure should be below 120/80. The first ("top") number is called the systolic pressure. It is a measure of the pressure in your arteries as your heart beats. The second ("bottom") number is called the diastolic pressure. It is a measure of the pressure in your arteries as the heart relaxes. What are the causes? The exact cause of this condition is not known. There are some conditions that result in or are related to high blood pressure. What increases the risk? Some risk factors for high blood pressure are under your control. The following factors may make you more likely to develop this condition:  Smoking.  Having type 2 diabetes mellitus, high cholesterol, or both.  Not getting enough exercise or physical activity.  Being overweight.  Having too much fat, sugar, calories, or salt (sodium) in your diet.  Drinking too much alcohol. Some risk factors for high blood pressure may be difficult or impossible to change. Some of these factors include:  Having chronic kidney disease.  Having a family history of high blood pressure.  Age. Risk increases with age.  Race. You may be at higher risk if you are African American.  Gender. Men are at higher risk than women before age 51. After age 51, women are at higher risk than men.  Having obstructive sleep apnea.  Stress. What are the signs or symptoms? High blood pressure may not cause symptoms. Very high blood pressure (hypertensive crisis) may cause:  Headache.  Anxiety.  Shortness of breath.  Nosebleed.  Nausea and vomiting.  Vision changes.  Severe chest pain.  Seizures. How is this diagnosed? This condition is diagnosed by measuring your blood pressure while you are seated, with your arm resting on a flat surface, your legs uncrossed, and your feet flat on the floor. The cuff of the blood pressure monitor will be placed directly against the skin of your upper arm at the level of your  heart. It should be measured at least twice using the same arm. Certain conditions can cause a difference in blood pressure between your right and left arms. Certain factors can cause blood pressure readings to be lower or higher than normal for a short period of time:  When your blood pressure is higher when you are in a health care provider's office than when you are at home, this is called white coat hypertension. Most people with this condition do not need medicines.  When your blood pressure is higher at home than when you are in a health care provider's office, this is called masked hypertension. Most people with this condition may need medicines to control blood pressure. If you have a high blood pressure reading during one visit or you have normal blood pressure with other risk factors, you may be asked to:  Return on a different day to have your blood pressure checked again.  Monitor your blood pressure at home for 1 week or longer. If you are diagnosed with hypertension, you may have other blood or imaging tests to help your health care provider understand your overall risk for other conditions. How is this treated? This condition is treated by making healthy lifestyle changes, such as eating healthy foods, exercising more, and reducing your alcohol intake. Your health care provider may prescribe medicine if lifestyle changes are not enough to get your blood pressure under control, and if:  Your systolic blood pressure is above 130.  Your diastolic blood pressure is above 80. Your personal target blood pressure may vary depending on your medical conditions, your age, and other factors. Follow these instructions at home: Eating and drinking   Eat a diet that is high in fiber and potassium, and low in sodium, added sugar, and fat. An example eating plan is called the DASH (Dietary Approaches to Stop Hypertension) diet. To eat this way: ? Eat plenty of fresh fruits and vegetables. Try  to fill one half of your plate at each meal with fruits and vegetables. ? Eat whole grains, such as whole-wheat pasta, brown rice, or whole-grain bread. Fill about one fourth of your plate with whole grains. ? Eat or drink low-fat dairy products, such as skim milk or low-fat yogurt. ? Avoid fatty cuts of meat, processed or cured meats, and poultry with skin. Fill about one fourth of your plate with lean proteins, such as fish, chicken without skin, beans, eggs, or tofu. ? Avoid pre-made and processed foods. These tend to be higher in sodium, added sugar, and fat.  Reduce your daily sodium intake. Most people with hypertension should eat less than 1,500 mg of sodium a day.  Do not drink alcohol if: ? Your health care provider tells you not to drink. ? You are pregnant, may be pregnant, or are planning to become pregnant.  If you drink alcohol: ? Limit how much you use to:  0-1 drink a day for women.  0-2 drinks a day for men. ? Be aware of how much alcohol is in your drink. In the [place] one drink equals one 12 oz bottle of beer (355 mL), one 5 oz glass of wine (148 mL), or one 1 oz glass of hard liquor (44 mL). Lifestyle   Work with your health care provider to maintain a healthy body weight or to lose weight. Ask what an ideal weight is for you.  Get at least 30 minutes of exercise most days of the week. Activities may include walking, swimming, or biking.  Include exercise to strengthen your muscles (resistance exercise), such as Pilates or lifting weights, as part of your weekly exercise routine. Try to do these types of exercises for 30 minutes at least 3 days a week.  Do not use any products that contain nicotine or tobacco, such as cigarettes, e-cigarettes, and chewing tobacco. If you need help quitting, ask your health care provider.  Monitor your blood pressure at home as told by your health care provider.  Keep all follow-up visits as told by your health care provider. This  is important. Medicines  Take over-the-counter and prescription medicines only as told by your health care provider. Follow directions carefully. Blood pressure medicines must be taken as prescribed.  Do not skip doses of blood pressure medicine. Doing this puts you at risk for problems and can make the medicine less effective.  Ask your health care provider about side effects or reactions to medicines that you should watch for. Contact a health care provider if you:  Think you are having a reaction to a medicine you are taking.  Have headaches that keep coming back (recurring).  Feel dizzy.  Have swelling in your ankles.  Have trouble with your vision. Get help right away if you:  Develop a severe headache or confusion.  Have unusual weakness or numbness.  Feel faint.  Have severe pain in your chest or abdomen.  Vomit repeatedly.  Have trouble breathing. Summary  Hypertension is when the force of  blood pumping through your arteries is too strong. If this condition is not controlled, it may put you at risk for serious complications.  Your personal target blood pressure may vary depending on your medical conditions, your age, and other factors. For most people, a normal blood pressure is less than 120/80.  Hypertension is treated with lifestyle changes, medicines, or a combination of both. Lifestyle changes include losing weight, eating a healthy, low-sodium diet, exercising more, and limiting alcohol. This information is not intended to replace advice given to you by your health care provider. Make sure you discuss any questions you have with your health care provider. Document Revised: 02/24/2018 Document Reviewed: 02/24/2018 Elsevier Patient Education  2020 [employer] Inc[employer]  Type 2 Diabetes Mellitus, Diagnosis, Adult Type 2 diabetes (type 2 diabetes mellitus) is a long-term (chronic) disease. In type 2 diabetes, one or both of these problems may be present:  The pancreas  does not make enough of a hormone called insulin.  Cells in the body do not respond properly to insulin that the body makes (insulin resistance). Normally, insulin allows blood sugar (glucose) to enter cells in the body. The cells use glucose for energy. Insulin resistance or lack of insulin causes excess glucose to build up in the blood instead of going into cells. As a result, high blood glucose (hyperglycemia) develops. What increases the risk? The following factors may make you more likely to develop type 2 diabetes:  Having a family member with type 2 diabetes.  Being overweight or obese.  Having an inactive (sedentary) lifestyle.  Having been diagnosed with insulin resistance.  Having a history of prediabetes, gestational diabetes, or polycystic ovary syndrome (PCOS).  Being of American-Indian, African-American, Hispanic/Latino, or Asian/Pacific Islander descent. What are the signs or symptoms? In the early stage of this condition, you may not have symptoms. Symptoms develop slowly and may include:  Increased thirst (polydipsia).  Increased hunger(polyphagia).  Increased urination (polyuria).  Increased urination during the night (nocturia).  Unexplained weight loss.  Frequent infections that keep coming back (recurring).  Fatigue.  Weakness.  Vision changes, such as blurry vision.  Cuts or bruises that are slow to heal.  Tingling or numbness in the hands or feet.  Dark patches on the skin (acanthosis nigricans). How is this diagnosed? This condition is diagnosed based on your symptoms, your medical history, a physical exam, and your blood glucose level. Your blood glucose may be checked with one or more of the following blood tests:  A fasting blood glucose (FBG) test. You will not be allowed to eat (you will fast) for 8 hours or longer before a blood sample is taken.  A random blood glucose test. This test checks blood glucose at any time of day regardless of  when you ate.  An A1c (hemoglobin A1c) blood test. This test provides information about blood glucose control over the previous 2-3 months.  An oral glucose tolerance test (OGTT). This test measures your blood glucose at two times: ? After fasting. This is your baseline blood glucose level. ? Two hours after drinking a beverage that contains glucose. You may be diagnosed with type 2 diabetes if:  Your FBG level is 126 mg/dL (7.0 mmol/L) or higher.  Your random blood glucose level is 200 mg/dL (96.7 mmol/L) or higher.  Your A1c level is 6.5% or higher.  Your OGTT result is higher than 200 mg/dL (89.3 mmol/L). These blood tests may be repeated to confirm your diagnosis. How is this treated? Your treatment  may be managed by a specialist called an endocrinologist. Type 2 diabetes may be treated by following instructions from your health care provider about:  Making diet and lifestyle changes. This may include: ? Following an individualized nutrition plan that is developed by a diet and nutrition specialist (registered dietitian). ? Exercising regularly. ? Finding ways to manage stress.  Checking your blood glucose level as often as told.  Taking diabetes medicines or insulin daily. This helps to keep your blood glucose levels in the healthy range. ? If you use insulin, you may need to adjust the dosage depending on how physically active you are and what foods you eat. Your health care provider will tell you how to adjust your dosage.  Taking medicines to help prevent complications from diabetes, such as: ? Aspirin. ? Medicine to lower cholesterol. ? Medicine to control blood pressure. Your health care provider will set individualized treatment goals for you. Your goals will be based on your age, other medical conditions you have, and how you respond to diabetes treatment. Generally, the goal of treatment is to maintain the following blood glucose levels:  Before meals (preprandial):  80-130 mg/dL (1.6-1.0 mmol/L).  After meals (postprandial): below 180 mg/dL (10 mmol/L).  A1c level: less than 7%. Follow these instructions at home: Questions to ask your health care provider  Consider asking the following questions: ? Do I need to meet with a diabetes educator? ? Where can I find a support group for people with diabetes? ? What equipment will I need to manage my diabetes at home? ? What diabetes medicines do I need, and when should I take them? ? How often do I need to check my blood glucose? ? What number can I call if I have questions? ? When is my next appointment? General instructions  Take over-the-counter and prescription medicines only as told by your health care provider.  Keep all follow-up visits as told by your health care provider. This is important.  For more information about diabetes, visit: ? American Diabetes Association (ADA): www.diabetes.org ? American Association of Diabetes Educators (AADE): www.diabeteseducator.org Contact a health care provider if:  Your blood glucose is at or above 240 mg/dL (96.0 mmol/L) for 2 days in a row.  You have been sick or have had a fever for 2 days or longer, and you are not getting better.  You have any of the following problems for more than 6 hours: ? You cannot eat or drink. ? You have nausea and vomiting. ? You have diarrhea. Get help right away if:  Your blood glucose is lower than 54 mg/dL (3.0 mmol/L).  You become confused or you have trouble thinking clearly.  You have difficulty breathing.  You have moderate or large ketone levels in your urine. Summary  Type 2 diabetes (type 2 diabetes mellitus) is a long-term (chronic) disease. In type 2 diabetes, the pancreas does not make enough of a hormone called insulin, or cells in the body do not respond properly to insulin that the body makes (insulin resistance).  This condition is treated by making diet and lifestyle changes and taking  diabetes medicines or insulin.  Your health care provider will set individualized treatment goals for you. Your goals will be based on your age, other medical conditions you have, and how you respond to diabetes treatment.  Keep all follow-up visits as told by your health care provider. This is important. This information is not intended to replace advice given to you by your  health care provider. Make sure you discuss any questions you have with your health care provider. Document Revised: 08/14/2017 Document Reviewed: 07/20/2015 Elsevier Patient Education  2020 [employer].

## 2019-11-21 NOTE — Progress Notes (Signed)
New Patient Office Visit  Subjective:  Patient ID: Joseph Burch, male    DOB: September 02, 1970  Age: 49 y.o. MRN: [id number]  CC:  Chief Complaint  Patient presents with  . New Patient (Initial Visit)    TOC    HPI Joseph Burch is a 49 yo male with hx of HTN, T2DM who presents to establish care with a primary care provider. He had a recent DOT physical and was found to have positive urine test for blood and glucose. He brings in a physical clearance form that he needs completed.   He went to the ED 09/24/2019 for dental infection and cellulitis. The  antibiotics cleared the gum pain and infection and he has not seen a dentist.  He was asked to follow-up with a primary care provider.   T2DM: He had an elevated A1c of 7.5 on 03/25/2019. He was started on Metformin 500 mg BID and could only tolerate it for 1.5 weeks because it gave him diarrhea. 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 He is not really certain that he has DM. He says he did not get DM teaching and needs to know more. He would like a referral for DM eduction. He has been treating his DM with diet and exercise. He and his wife are on WT WATCHER's.  He has been checking his FBS- 125 and 1 hr-PP - 175. He bought an exercise reclining outdoor bike that is a total body workout and he has been using it. Positive intentional Wt loss. Not at goal- but working hard and very motivated.   Wt Readings from Last 3 Encounters:  11/21/19 177 lb (80.3 kg)  09/24/19 183 lb (83 kg)  03/25/19 185 lb (83.9 kg)    Microscopic hematuria: DOT physical showed blood on dip stick. He has been told he had blood in urine every UA checked for 29 years. He saw Urology in the distant past and checked it and can't find anything.  He denies any dysuria, frequency, hematuria, flank pain, bladder pain, trouble with his stream hx of kidney stones, current abdominal or flank pain or prostate symptoms. 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    HTN: He had an elevated BP in the ED- 199/106, but attributes that to the pain he was in. He was started on amlodipine 5 mg daily 03/25/2019. He checks his blood pressure at home and it runs around 134/79.  He has noted no side effects with the amlodipine.  No chest pain, palpitations, shortness of breath, or edema.  ASA 81 - he placed himself ASA and has done well with no GI concerns.   Xyphoid bony prominence: He brings my attention to a bony prominence at the distal end of his xiphoid.  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  This is nontender, is not enlarging and does not bother him in any way except that he feels like there is something abnormal there and would like this evaluated. No hx of injury.    Past Medical History:  Diagnosis Date  . Diabetes mellitus without complication ([place])   . Hypertension     History reviewed. No pertinent surgical history.  Family History  Problem Relation Age of Onset  . Alcohol abuse Mother   . Cancer Mother   . COPD Mother   . Mental illness Mother   . Cancer Father   . Diabetes  Father   . Hypertension Father   . Stroke Father   . Drug abuse Sister   . Early death Brother     Social History   Socioeconomic History  . Marital status: Married    Spouse name: Not on file  . Number of children: Not on file  . Years of education: Not on file  . Highest education level: Associate degree: occupational, Scientist, product/process development, or vocational program  Occupational History  . Occupation: Truck [employer] doctor  Tobacco Use  . Smoking status: Former Smoker    Types: E-cigarettes, Cigarettes  . Smokeless tobacco: Never Used  Substance and Sexual Activity  . Alcohol use: Yes    Comment: rare beer 1-2 per summer  . Drug use: Never  . Sexual activity: Yes  Other Topics Concern  . Not on file  Social History Narrative   Married lives with wife and children x 4.    Social Determinants of Health   Financial  Resource Strain:   . Difficulty of Paying Living Expenses:   Food Insecurity:   . Worried About Programme researcher, broadcasting/film/video in the Last Year:   . Barista in the Last Year:   Transportation Needs:   . Freight forwarder (Medical):   Marland Kitchen Lack of Transportation (Non-Medical):   Physical Activity:   . Days of Exercise per Week:   . Minutes of Exercise per Session:   Stress:   . Feeling of Stress :   Social Connections:   . Frequency of Communication with Friends and Family:   . Frequency of Social Gatherings with Friends and Family:   . Attends Religious Services:   . Active Member of Clubs or Organizations:   . Attends Banker Meetings:   Marland Kitchen Marital Status:   Intimate Partner Violence:   . Fear of Current or Ex-Partner:   . Emotionally Abused:   Marland Kitchen Physically Abused:   . Sexually Abused:     ROS Review of Systems  Constitutional: Negative for chills, fever and unexpected weight change.  HENT: Negative for congestion and sinus pressure.   Eyes: Negative.   Respiratory: Negative for cough and shortness of breath.   Cardiovascular: Negative for chest pain, palpitations and leg swelling.  Gastrointestinal: Negative for abdominal pain and blood in stool.  Genitourinary: Negative for difficulty urinating.  Musculoskeletal: Negative for arthralgias and back pain.  Allergic/Immunologic: Negative for environmental allergies.  Neurological: Negative for dizziness, seizures and headaches.  Hematological: Negative for adenopathy. Does not bruise/bleed easily.  Psychiatric/Behavioral:       No concerns about depression /anxiety    Objective:   Today's Vitals: BP (!) 138/100 (BP Location: Left Arm, Patient Position: Sitting, Cuff Size: Normal)   Pulse 74   Temp (!) 97.4 F (36.3 C) (Skin)   Ht 5\' 6"  (1.676 m)   Wt 177 lb (80.3 kg)   SpO2 98%   BMI 28.57 kg/m   Physical Exam Vitals reviewed.  Constitutional:      Appearance: Normal appearance.  HENT:     Head:  Normocephalic and atraumatic.  Eyes:     Conjunctiva/sclera: Conjunctivae normal.     Pupils: Pupils are equal, round, and reactive to light.  Cardiovascular:     Rate and Rhythm: Normal rate and regular rhythm.  Pulmonary:     Effort: Pulmonary effort is normal.     Breath sounds: Normal breath sounds.  Abdominal:     Palpations: Abdomen is soft.     Tenderness: There  is no abdominal tenderness.  Musculoskeletal:        General: Normal range of motion.     Cervical back: Normal range of motion and neck supple.     Right lower leg: No edema.     Left lower leg: No edema.     Comments: Distal sternum with obvious bony enlargement. Non tender.   Skin:    General: Skin is warm and dry.  Neurological:     General: No focal deficit present.     Mental Status: He is alert and oriented to person, place, and time.  Psychiatric:        Mood and Affect: Mood normal.        Behavior: Behavior normal.        Thought Content: Thought content normal.        Judgment: Judgment normal.     Comments: Gd-7: 4 and PHQ-9: 6. Denies SI/HI.      Assessment & Plan:   Problem List Items Addressed This Visit      Cardiovascular and Mediastinum   Essential hypertension     Endocrine   Type 2 diabetes mellitus without complication, without long-term current use of insulin (HCC)   Relevant Orders   CBC with Differential/Platelet (Completed)   TSH (Completed)   Comprehensive metabolic panel (Completed)   Hemoglobin A1c (Completed)   Lipid panel (Completed)   Microalbumin / creatinine urine ratio (Completed)   Ambulatory referral to Nutrition and Diabetic Education     Musculoskeletal and Integument   Deformity of sternum   Relevant Orders   Ambulatory referral to Orthopedic Surgery     Genitourinary   Asymptomatic microscopic hematuria   Relevant Orders   Urinalysis, Routine w reflex microscopic (Completed)     Other   Encounter for medical examination to establish care - Primary        Outpatient Encounter Medications as of 11/21/2019  Medication Sig  . amLODipine (NORVASC) 5 MG tablet Take 1 tablet (5 mg total) by mouth daily.  . [DISCONTINUED] clindamycin (CLEOCIN) 300 MG capsule Take 1 capsule (300 mg total) by mouth 4 (four) times daily. (Patient not taking: Reported on 11/21/2019)  . [DISCONTINUED] HYDROcodone-acetaminophen (NORCO/VICODIN) 5-325 MG tablet Take 1 tablet by mouth every 4 (four) hours as needed for moderate pain. (Patient not taking: Reported on 11/21/2019)  . [DISCONTINUED] metFORMIN (GLUCOPHAGE) 500 MG tablet Take 1 tablet (500 mg total) by mouth 2 (two) times daily with a meal. (Patient not taking: Reported on 11/21/2019)  . [DISCONTINUED] ondansetron (ZOFRAN ODT) 4 MG disintegrating tablet Take 1 tablet (4 mg total) by mouth every 8 (eight) hours as needed for nausea or vomiting. (Patient not taking: Reported on 11/21/2019)   No facility-administered encounter medications on file as of 11/21/2019.   He also placed himself on ASA 81 mg daily- needs added to med list.   Please go to the lab today. Will check UA for hematuria. Consider Urology referral.  Check A1c and need for  DM treatment. If A1c is not at goal< 7.0, I would like to add back once daily Metformin XR and rule out that class before moving on to SGLT-2 which has nephroprotective data. Advised to check fasting blood sugar in the morning- before you eat or drink and check x3 times a week. Also, check 2 hours after supper x 1-2 times a week.  I have referred you to the DIABETES EDUCATION.   I have referred you to ORTHOPEDICS because of your concern about  bony prominence at breastbone.   Please follow up in 2 months with BP readings.Goal BP <130/80. He is 138/100-not at goal, but reports home readings are much better. He is losing weight and exercising.   I provided  45 minutes of time during this encounter reviewing patient's current problems and past surgeries, labs and imaging studies,  providing counseling on the above mentioned problems , and coordination  of care .  Follow-up: Return in about 2 months (around 01/21/2020).   This visit occurred during the SARS-CoV-2 public health emergency.  Safety protocols were in place, including screening questions prior to the visit, additional usage of staff PPE, and extensive cleaning of exam room while observing appropriate contact time as indicated for disinfecting solutions.   Amedeo Kinsman, NP

## 2019-11-22 ENCOUNTER — Telehealth: Payer: Self-pay | Admitting: Nurse Practitioner

## 2019-11-22 ENCOUNTER — Encounter: Payer: Self-pay | Admitting: Nurse Practitioner

## 2019-11-22 DIAGNOSIS — I152 Hypertension secondary to endocrine disorders: Secondary | ICD-10-CM | POA: Insufficient documentation

## 2019-11-22 DIAGNOSIS — R3121 Asymptomatic microscopic hematuria: Secondary | ICD-10-CM

## 2019-11-22 DIAGNOSIS — E119 Type 2 diabetes mellitus without complications: Secondary | ICD-10-CM | POA: Insufficient documentation

## 2019-11-22 DIAGNOSIS — I10 Essential (primary) hypertension: Secondary | ICD-10-CM | POA: Insufficient documentation

## 2019-11-22 DIAGNOSIS — Z1211 Encounter for screening for malignant neoplasm of colon: Secondary | ICD-10-CM | POA: Insufficient documentation

## 2019-11-22 DIAGNOSIS — E1129 Type 2 diabetes mellitus with other diabetic kidney complication: Secondary | ICD-10-CM | POA: Insufficient documentation

## 2019-11-22 DIAGNOSIS — M954 Acquired deformity of chest and rib: Secondary | ICD-10-CM | POA: Insufficient documentation

## 2019-11-22 DIAGNOSIS — E1159 Type 2 diabetes mellitus with other circulatory complications: Secondary | ICD-10-CM | POA: Insufficient documentation

## 2019-11-22 NOTE — Addendum Note (Signed)
Addended by: Amedeo Kinsman A on: 11/22/2019 05:49 PM   Modules accepted: Orders

## 2019-11-22 NOTE — Telephone Encounter (Addendum)
Labs have not been sent to be yet to review with the patient. I will call will results once given to me.

## 2019-11-22 NOTE — Telephone Encounter (Signed)
Pt would like a call back about his lab results that he saw on Mychart

## 2019-11-23 ENCOUNTER — Telehealth: Payer: Self-pay | Admitting: Nurse Practitioner

## 2019-11-23 MED ORDER — METFORMIN HCL ER 500 MG PO TB24: ORAL_TABLET | ORAL | 1 refills | Status: DC

## 2019-11-23 NOTE — Telephone Encounter (Signed)
I spoke to him and discussed  his laboratory studies.  He is willing to try the Metformin XR 500 mg 1 dose with evening meal. He started out with Metformin twice a day and he may be able to tolerate a lower dose and the XR maybe better. This does not cause hypoglycemia.   If he gets diarrhea that interrupts his workflow, we will have to discard Metformin.  We can then consider SGLT- 2 because of its good nephroprotective data.  His hemoglobin A1c is not too elevated so he should not be bothered too much with urinary frequency.  We discussed the blood in the urine, and he is agreeable to seeing Urology.  I will place a referral.  Office visit with me in July and he is dieting/exercising and monitoring his BP on amlodipine.  Goal is less than 130/80. He has a DM lifestyle/Nutrition consult pending.

## 2019-11-23 NOTE — Telephone Encounter (Signed)
Spoke with patient and he is aware that DOT form/letter is in process. 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 He thinks the letter is due by 12/04/19 as this is when he DOT physical runs out.

## 2019-11-23 NOTE — Addendum Note (Signed)
Addended by: Amedeo Kinsman A on: 11/23/2019 07:00 AM   Modules accepted: Orders

## 2019-11-23 NOTE — Telephone Encounter (Signed)
Pt needs DOT letter/form that was discussed at last visit. Please call to advise

## 2019-11-24 ENCOUNTER — Telehealth: Payer: Self-pay | Admitting: Nurse Practitioner

## 2019-11-24 NOTE — Telephone Encounter (Signed)
DOT form no longer needed

## 2019-11-24 NOTE — Telephone Encounter (Signed)
Pt called and stated that he did not need the DOT paper work now he has it and to say thank you.

## 2019-12-05 ENCOUNTER — Telehealth: Payer: Self-pay | Admitting: Nurse Practitioner

## 2019-12-05 MED ORDER — AMLODIPINE BESYLATE 5 MG PO TABS: 5.0000 mg | ORAL_TABLET | Freq: Every day | ORAL | 1 refills | Status: DC

## 2019-12-05 NOTE — Telephone Encounter (Signed)
Pt needs a refill on amLODipine (NORVASC) 5 MG tablet. Pt is completely out and is going out of town today. Needs it asap-send to [facility] on [address]. Pt would like a call when complete.

## 2019-12-26 ENCOUNTER — Encounter: Payer: Self-pay | Admitting: Urology

## 2019-12-26 ENCOUNTER — Ambulatory Visit (INDEPENDENT_AMBULATORY_CARE_PROVIDER_SITE_OTHER): Payer: 59 | Admitting: Urology

## 2019-12-26 ENCOUNTER — Other Ambulatory Visit: Payer: Self-pay

## 2019-12-26 VITALS — BP 135/84 | HR 66 | Ht 66.0 in | Wt 173.7 lb

## 2019-12-26 DIAGNOSIS — R3121 Asymptomatic microscopic hematuria: Secondary | ICD-10-CM | POA: Diagnosis not present

## 2019-12-26 NOTE — Progress Notes (Signed)
PATIENT ID: Joseph Burch, male     DOB: [birth year], 49 y.o.     MRN: [id number]   ENCOUNTER: 12/26/19, 12:46 PM     REFERRING PROVIDER: Marval Regal, NP [address] [place],  [place] [address]  Chief Complaint  Patient presents with  . Hematuria    HPI: Joseph Burch is a 49 y.o. male presenting today for an evaluation of his asymptomatic microscopic hematuria, which was found during routine lab work on 12/22/2019. This is not the first time that he has had hematuria, and he notes that he first had asymptomatic microscopic hematuria at the age of 43 during a drug screening; his asymptomatic microscopic hematuria has been present since then.  Today he reports: . No prior urologic history . Smoked for 28 years; 1 pack/day for a few years but otherwise less than that . He denies gross hematuria . He denies any urinary issues, including frequency, urgency, and flank pain . Available UA results in Epic: 10/2019 (3-6 RBC); 12/2013 (4-10 RBC)   PMHx: Past Medical History:  Diagnosis Date  . Diabetes mellitus without complication ([place])   . Hematuria    onset 29 years ago- cause unkown to the patient  . Hypertension     SURGICAL HISTORY: History reviewed. No pertinent surgical history.  HOME MEDICATIONS:  Allergies as of 12/26/2019      Reactions   Guaifenesin Itching   Codeine Rash   Other Rash      Medication List       Accurate as of December 26, 2019 12:46 PM. If you have any questions, ask your nurse or doctor.        amLODipine 5 MG tablet Commonly known as: NORVASC Take 1 tablet (5 mg total) by mouth daily.   metFORMIN 500 MG 24 hr tablet Commonly known as: Glucophage XR Start 500mg  PO qpm.       ALLERGIES: Allergies  Allergen Reactions  . Guaifenesin Itching  . Codeine Rash  . Other Rash    FAMILY HISTORY: Family History  Problem Relation Age of Onset  . Alcohol abuse Mother   . Cancer Mother   . COPD Mother   . Mental  illness Mother   . Cancer Father   . Diabetes Father   . Hypertension Father   . Stroke Father   . Drug abuse Sister   . Early death Brother     SOCIAL HISTORY:  reports that he has quit smoking. His smoking use included e-cigarettes and cigarettes. He has never used smokeless tobacco. He reports current alcohol use. He reports that he does not use drugs.  PHYSICAL EXAM: BP 135/84   Pulse 66   Ht 5\' 6"  (1.676 m)   Wt 173 lb 11.2 oz (78.8 kg)   BMI 28.04 kg/m   Constitutional:  Alert and oriented, No acute distress. HEENT: [place] AT, moist mucus membranes.  Trachea midline, no masses. Cardiovascular: No clubbing, cyanosis, or edema. Respiratory: Normal respiratory effort, no increased work of breathing. Skin: No rashes, bruises or suspicious lesions. Neurologic: Grossly intact, no focal deficits, moving all 4 extremities. Psychiatric: Normal mood and affect.   ASSESSMENT/PLAN:    AUA risk stratification: Intermediate  Discussed the recommended evaluation of intermediate risk hematuria to include renal ultrasound and cystoscopy  He has agreed ultrasound but declined cystoscopy at this time and would like to think over  Will notify with RUS results  And apparently first noted to have hematuria on urine  at age 55 and we discussed the possibility of familial entities and the need for nephrology evaluation if no anatomic abnormalities identified.  He has requested to proceed with a nephrology evaluation   Riki Altes, MD  [facility] [address], [address] [place], [place] [address] [phone]  By signing my name below, I, [employer], attest that this documentation has been prepared under the direction and in the presence of Irineo Axon, MD. Electronically Signed: Riki Altes, MD 12/26/19, 12:46 PM   I have reviewed the above documentation for accuracy and completeness, and I agree with the above.   Riki Altes, MD

## 2019-12-27 LAB — MICROSCOPIC EXAMINATION: Bacteria, UA: NONE SEEN

## 2019-12-27 LAB — URINALYSIS, COMPLETE
Bilirubin, UA: NEGATIVE
Glucose, UA: NEGATIVE
Ketones, UA: NEGATIVE
Leukocytes,UA: NEGATIVE
Nitrite, UA: NEGATIVE
Protein,UA: NEGATIVE
Specific Gravity, UA: 1.025 (ref 1.005–1.030)
Urobilinogen, Ur: 0.2 mg/dL (ref 0.2–1.0)
pH, UA: 6 (ref 5.0–7.5)

## 2020-01-03 ENCOUNTER — Ambulatory Visit: Payer: Self-pay | Admitting: Adult Health

## 2020-01-09 ENCOUNTER — Ambulatory Visit: Payer: 59 | Admitting: *Deleted

## 2020-01-09 ENCOUNTER — Ambulatory Visit: Payer: 59

## 2020-01-20 ENCOUNTER — Other Ambulatory Visit: Payer: Self-pay

## 2020-01-23 ENCOUNTER — Other Ambulatory Visit: Payer: Self-pay

## 2020-01-23 ENCOUNTER — Ambulatory Visit (INDEPENDENT_AMBULATORY_CARE_PROVIDER_SITE_OTHER): Payer: 59 | Admitting: Nurse Practitioner

## 2020-01-23 ENCOUNTER — Encounter: Payer: Self-pay | Admitting: Nurse Practitioner

## 2020-01-23 VITALS — BP 138/82 | HR 66 | Temp 97.4°F | Ht 66.0 in | Wt 173.0 lb

## 2020-01-23 DIAGNOSIS — I10 Essential (primary) hypertension: Secondary | ICD-10-CM | POA: Diagnosis not present

## 2020-01-23 DIAGNOSIS — E1169 Type 2 diabetes mellitus with other specified complication: Secondary | ICD-10-CM

## 2020-01-23 MED ORDER — LOSARTAN POTASSIUM 50 MG PO TABS: 50.0000 mg | ORAL_TABLET | Freq: Every day | ORAL | 0 refills | Status: DC

## 2020-01-23 NOTE — Patient Instructions (Addendum)
For your BP -  Losartan:   Begin with 1/2 tablet daily and monitor BP daily for 3 days and call in the readings.   If the BP gets too low- or you feel dizzy/lightheaded- hold  the amlodipine and take the losartan alone.     Please call back the lifestyle Center for your initial diabetes teaching.  Continue with the Metformin and supper.  Eye exam with [facility]- they should accept your insurance.    Please return for blood pressure check in the office in 2 weeks and will check laboratory studies at that time.

## 2020-01-23 NOTE — Progress Notes (Signed)
Established Patient Office Visit  Subjective:  Patient ID: Joseph Burch, male    DOB: 27-Oct-1970  Age: 49 y.o. MRN: [id number]  CC:  Chief Complaint  Patient presents with  . Follow-up    hypertension and diabetes    HPI Joseph Burch is a 49 year old who establish care in May for hypertension and diabetes mellitus.  Essential hypertension:  In 2017, BP  136/88.  HTN was diagnosed in the emergency department 03/25/2019 with blood pressure 169/110 heart rate 64. Seen in the ED 09/24/2019 for dental  cellulitis and BP 199/106.   Currently, taking amlodipine 5 mg daily at 6 pm since 03/25/2019.  He reports blood pressure at home is around 134/79.  He reports his systolic blood pressure is never below 130's. He has noted no edema.  No problems with chest pain, palpitations, shortness of breath or DOE.   BP Readings from Last 3 Encounters:  01/23/20 (!) 138/82  12/26/19 135/84  11/21/19 (!) 138/100    Pulse Readings from Last 3 Encounters:  01/23/20 66  12/26/19 66  11/21/19 74   T2DM: A1c: 7.7 not at goal <7.0. Started taking once daily Metformin at 6 pm .  Gets diarrhea if he takes it twice daily.  FBS 121-125 and 2 hr pp-80's- may not eat breakfast.  Vision:needs  eye exam. His micro albumin creatinine ratio is 2.8 well under 30.  BMI 27.9 overweight: Try to eat healthier.  Today he has a large garden.  He wants vegetables, lean grilled meats. He did not attend his lifestyle diabetes meeting on July 12, but will re-schedule.   HLD:  LDL 106-goal would be less than 100 so not at goal but he is working on this.  Diet management.  Lab Results  Component Value Date   HGBA1C 7.7 (H) 11/21/2019   Lab Results  Component Value Date   LABMICR See below: 12/26/2019   MICROALBUR 3.3 (H) 11/21/2019   Lab Results  Component Value Date   CHOL 170 11/21/2019   HDL 39.40 11/21/2019   LDLCALC 106 (H) 11/21/2019   TRIG 125.0 11/21/2019   CHOLHDL 4 11/21/2019    Microscopic hematuria  chronic: He saw Dr. Lonna Cobb and was told that there was no pathology present.  Advised to monitor and report if he has visible hematuria.  Xiphoid prominence: He was seen by orthopedics, Dr. Real Cons, and he was told no pathology there on Xray- need records.  The patient notices that as he loses weight, he has a bony knob projection that sticks out more.  He simply does not like this and would consider having this removed surgically in the future if this is feasible.  He has bright insurance, and is concerned about cost.    Health maintenance: He was advised today to get colonoscopy or other colon cancer screening.  He was advised to get a tetanus booster at his pharmacy.  He declines Covid vaccine.  Past Medical History:  Diagnosis Date  . Diabetes mellitus without complication (HCC)   . Hematuria    onset 29 years ago- cause unkown to the patient  . Hypertension     No past surgical history on file.  Family History  Problem Relation Age of Onset  . Alcohol abuse Mother   . Cancer Mother   . COPD Mother   . Mental illness Mother   . Cancer Father   . Diabetes Father   . Hypertension Father   . Stroke Father   .  Drug abuse Sister   . Early death Brother     Social History   Socioeconomic History  . Marital status: Married    Spouse name: Not on file  . Number of children: Not on file  . Years of education: Not on file  . Highest education level: Associate degree: occupational, Scientist, product/process developmenttechnical, or vocational program  Occupational History  . Occupation: Truck Hospital doctorDriver  Tobacco Use  . Smoking status: Former Smoker    Types: E-cigarettes, Cigarettes  . Smokeless tobacco: Never Used  Vaping Use  . Vaping Use: Never used  Substance and Sexual Activity  . Alcohol use: Yes    Comment: rare beer 1-2 per summer  . Drug use: Never  . Sexual activity: Yes  Other Topics Concern  . Not on file  Social History Narrative   Married lives with wife and children x 4.    Social  Determinants of Health   Financial Resource Strain:   . Difficulty of Paying Living Expenses:   Food Insecurity:   . Worried About Programme researcher, broadcasting/film/videounning Out of Food in the Last Year:   . Baristaan Out of Food in the Last Year:   Transportation Needs:   . Freight forwarderLack of Transportation (Medical):   Marland Kitchen. Lack of Transportation (Non-Medical):   Physical Activity:   . Days of Exercise per Week:   . Minutes of Exercise per Session:   Stress:   . Feeling of Stress :   Social Connections:   . Frequency of Communication with Friends and Family:   . Frequency of Social Gatherings with Friends and Family:   . Attends Religious Services:   . Active Member of Clubs or Organizations:   . Attends BankerClub or Organization Meetings:   Marland Kitchen. Marital Status:   Intimate Partner Violence:   . Fear of Current or Ex-Partner:   . Emotionally Abused:   Marland Kitchen. Physically Abused:   . Sexually Abused:     Outpatient Medications Prior to Visit  Medication Sig Dispense Refill  . amLODipine (NORVASC) 5 MG tablet Take 1 tablet (5 mg total) by mouth daily. 90 tablet 1  . aspirin EC 81 MG tablet Take 81 mg by mouth daily. Swallow whole.    . metFORMIN (GLUCOPHAGE XR) 500 MG 24 hr tablet Start 500mg  PO qpm. 90 tablet 1   No facility-administered medications prior to visit.    Allergies  Allergen Reactions  . Guaifenesin Itching  . Codeine Rash  . Other Rash   Review of Systems  Constitutional: Negative for chills and fever.  HENT: Negative.   Eyes: Negative.   Respiratory: Negative for cough and shortness of breath.   Cardiovascular: Negative for chest pain, palpitations and leg swelling.  Gastrointestinal: Negative.   Endocrine: Negative.   Genitourinary: Negative.   Musculoskeletal: Negative.        Large bony prominence distal sternum/xyphoid   Skin: Negative.   Allergic/Immunologic: Negative.   Neurological: Negative.   Psychiatric/Behavioral: Negative.   All other systems reviewed and are negative.     Objective:      Physical Exam Vitals reviewed.  Constitutional:      Appearance: Normal appearance.  HENT:     Head: Normocephalic.  Eyes:     Conjunctiva/sclera: Conjunctivae normal.     Pupils: Pupils are equal, round, and reactive to light.  Cardiovascular:     Rate and Rhythm: Normal rate and regular rhythm.     Pulses: Normal pulses.     Heart sounds: Normal heart sounds.  Pulmonary:  Effort: Pulmonary effort is normal.     Breath sounds: Normal breath sounds.  Abdominal:     Palpations: Abdomen is soft.     Tenderness: There is no abdominal tenderness.  Musculoskeletal:        General: Normal range of motion.     Cervical back: Normal range of motion.     Comments: Deformity of sternum  Skin:    General: Skin is warm and dry.  Neurological:     General: No focal deficit present.     Mental Status: He is alert and oriented to person, place, and time.  Psychiatric:        Mood and Affect: Mood normal.        Behavior: Behavior normal.        Thought Content: Thought content normal.     BP (!) 138/82 (BP Location: Left Arm, Patient Position: Sitting, Cuff Size: Normal)   Pulse 66   Temp (!) 97.4 F (36.3 C) (Oral)   Ht 5\' 6"  (1.676 m)   Wt 173 lb (78.5 kg)   SpO2 99%   BMI 27.92 kg/m  Wt Readings from Last 3 Encounters:  01/23/20 173 lb (78.5 kg)  12/26/19 173 lb 11.2 oz (78.8 kg)  11/21/19 177 lb (80.3 kg)     Health Maintenance Due  Topic Date Due  . Hepatitis C Screening  Never done  . PNEUMOCOCCAL POLYSACCHARIDE VACCINE AGE 15-64 HIGH RISK  Never done  . FOOT EXAM  Never done  . OPHTHALMOLOGY EXAM  Never done  . HIV Screening  Never done  . TETANUS/TDAP  Never done  . COLONOSCOPY  Never done    There are no preventive care reminders to display for this patient.  Lab Results  Component Value Date   TSH 1.73 11/21/2019   Lab Results  Component Value Date   WBC 5.7 11/21/2019   HGB 15.9 11/21/2019   HCT 46.6 11/21/2019   MCV 87.2 11/21/2019   PLT  246.0 11/21/2019   Lab Results  Component Value Date   NA 138 11/21/2019   K 3.9 11/21/2019   CO2 29 11/21/2019   GLUCOSE 137 (H) 11/21/2019   BUN 23 11/21/2019   CREATININE 1.13 11/21/2019   BILITOT 0.7 11/21/2019   ALKPHOS 89 11/21/2019   AST 23 11/21/2019   ALT 25 11/21/2019   PROT 6.9 11/21/2019   ALBUMIN 4.4 11/21/2019   CALCIUM 9.5 11/21/2019   ANIONGAP 9 09/24/2019   GFR 68.44 11/21/2019   Lab Results  Component Value Date   CHOL 170 11/21/2019   Lab Results  Component Value Date   HDL 39.40 11/21/2019   Lab Results  Component Value Date   LDLCALC 106 (H) 11/21/2019   Lab Results  Component Value Date   TRIG 125.0 11/21/2019   Lab Results  Component Value Date   CHOLHDL 4 11/21/2019   Lab Results  Component Value Date   HGBA1C 7.7 (H) 11/21/2019      Assessment & Plan:   Problem List Items Addressed This Visit      Cardiovascular and Mediastinum   Essential hypertension - Primary    His blood pressure is not quite at goal on amlodipine 5 mg daily.  He is tolerating medication well.  No edema.  We have options to increase to 10 mg daily.  We discussed another option is to add a low-dose ARB for renal protection with diabetes.  Patient would like to be on 1 medication if possible.  Losartan:   Begin with 1/2 tablet daily and monitor BP daily for 3 days and call in the readings. Advised If the BP gets too low- or you feel dizzy/lightheaded- hold  the amlodipine and take the losartan alone.  OV in 2 weeks with  Bmet  and BP check.       Relevant Medications   aspirin EC 81 MG tablet   losartan (COZAAR) 50 MG tablet     Endocrine   Diabetes mellitus (HCC)    Referred back to nutrition/diabetes education.  He is eating healthier.  He is on Metformin only once a day.  I do not know that we will get him to goal with that . A1c of 7.7 but will monitor.      Relevant Medications   aspirin EC 81 MG tablet   losartan (COZAAR) 50 MG tablet      Meds  ordered this encounter  Medications  . losartan (COZAAR) 50 MG tablet    Sig: Take 1 tablet (50 mg total) by mouth daily.    Dispense:  90 tablet    Refill:  0    Order Specific Question:   Supervising Provider    Answer:   Dale [place] [[id number]]    Please return for blood pressure check in the office in 2 weeks and will check laboratory studies at that time.  Follow-up: Return in about 2 weeks (around 02/06/2020).  This visit occurred during the SARS-CoV-2 public health emergency.  Safety protocols were in place, including screening questions prior to the visit, additional usage of staff PPE, and extensive cleaning of exam room while observing appropriate contact time as indicated for disinfecting solutions.    Amedeo Kinsman, NP

## 2020-01-24 NOTE — Assessment & Plan Note (Addendum)
His blood pressure is not quite at goal on amlodipine 5 mg daily.  He is tolerating medication well.  No edema.  We have options to increase to 10 mg daily.  We discussed another option is to add a low-dose ARB for renal protection with diabetes.  Patient would like to be on 1 medication if possible.  Losartan:   Begin with 1/2 tablet daily and monitor BP daily for 3 days and call in the readings. Advised If the BP gets too low- or you feel dizzy/lightheaded- hold  the amlodipine and take the losartan alone.  OV in 2 weeks with  Bmet  and BP check.

## 2020-01-24 NOTE — Assessment & Plan Note (Signed)
Referred back to nutrition/diabetes education.  He is eating healthier.  He is on Metformin only once a day.  I do not know that we will get him to goal with that . A1c of 7.7 but will monitor.

## 2020-01-30 ENCOUNTER — Telehealth: Payer: Self-pay | Admitting: Nurse Practitioner

## 2020-01-30 NOTE — Telephone Encounter (Signed)
Pt called to give BP reads since starting the medication 7/26 -8/2 His BP have been around 130-138/79-95 Couldn't give exact just said around these numbers

## 2020-01-31 NOTE — Telephone Encounter (Signed)
He did not make a f/up appt as planned. He needs labs and OV on new start ARB. Plan: Please give him OV next week. Notify me if no show. I will order labs when I see him. Very important to do this. Ask him to bring in his BP readings.

## 2020-01-31 NOTE — Telephone Encounter (Signed)
Patient scheduled appt for 02/13/20 at 8:00 am; patient could only come in on a Monday.

## 2020-02-13 ENCOUNTER — Other Ambulatory Visit: Payer: Self-pay

## 2020-02-13 ENCOUNTER — Telehealth (INDEPENDENT_AMBULATORY_CARE_PROVIDER_SITE_OTHER): Payer: 59 | Admitting: Nurse Practitioner

## 2020-02-13 ENCOUNTER — Encounter: Payer: Self-pay | Admitting: Nurse Practitioner

## 2020-02-13 VITALS — Ht 66.0 in | Wt 165.0 lb

## 2020-02-13 DIAGNOSIS — Z1159 Encounter for screening for other viral diseases: Secondary | ICD-10-CM

## 2020-02-13 DIAGNOSIS — Z1211 Encounter for screening for malignant neoplasm of colon: Secondary | ICD-10-CM

## 2020-02-13 DIAGNOSIS — R3121 Asymptomatic microscopic hematuria: Secondary | ICD-10-CM

## 2020-02-13 DIAGNOSIS — E119 Type 2 diabetes mellitus without complications: Secondary | ICD-10-CM | POA: Diagnosis not present

## 2020-02-13 DIAGNOSIS — Z125 Encounter for screening for malignant neoplasm of prostate: Secondary | ICD-10-CM

## 2020-02-13 DIAGNOSIS — I10 Essential (primary) hypertension: Secondary | ICD-10-CM | POA: Diagnosis not present

## 2020-02-13 DIAGNOSIS — Z114 Encounter for screening for human immunodeficiency virus [HIV]: Secondary | ICD-10-CM

## 2020-02-13 DIAGNOSIS — Z0289 Encounter for other administrative examinations: Secondary | ICD-10-CM | POA: Insufficient documentation

## 2020-02-13 MED ORDER — AMLODIPINE BESYLATE 5 MG PO TABS: 10.0000 mg | ORAL_TABLET | Freq: Every day | ORAL | 1 refills | Status: DC

## 2020-02-13 MED ORDER — METFORMIN HCL ER 500 MG PO TB24: ORAL_TABLET | ORAL | 1 refills | Status: DC

## 2020-02-13 NOTE — Patient Instructions (Addendum)
Please stop the losartan for now.  Treat your hypertension with amlodipine 10 mg daily.  Please check your blood pressure every day for 3 days and send me reports through my chart.  Monitor for your headache.  Let me know if it continues and this will need to be worked up.   For your diabetes, you are on Metformin XR 500 mg daily.  Keep up the dieting and exercising. You have lost weight. Great job! We can check repeat gA1c anytime . You will need a laboratory appointment first thing in the morning to get fasting blood work done.  Please call the office to arrange the lab appt according to your schedule.  For colon cancer screening, I will place a referral into gastroenterology for colonoscopy.  For your dilated eye exam for diabetes, I have placed referral into [facility].  Please get back with Dr. Lonna Cobb regarding setting up a kidney ultrasound to check for any abnormal anatomy to explain the blood in the urine.  Please continue to monitor your blood pressure and pulse and  write it down in a notebook.  Bring it in next month.  We need to see if the amlodipine is going to be strong enough to control your hypertension.   Please get the Covid vaccine.  This takes priority over the other vaccines.  You are also due for tetanus booster, shingles vaccine, and flu shot.  Follow-up office visit in 4 weeks to go over labs, BP and blood sugar.

## 2020-02-13 NOTE — Assessment & Plan Note (Signed)
He is continue to work hard on his diet and exercise.  He tells me his weight is down to 165 pounds.  He has been exercising.  He is looking forward to seeing if his A1c is lower than 7.  He is still taking Metformin XR 500 mg once daily.  He has not had a dilated eye exam yet, but asked me to set that up for him.  He was given referral to [facility].

## 2020-02-13 NOTE — Assessment & Plan Note (Signed)
Treat your hypertension with amlodipine 10 mg daily.  Please check your blood pressure every day for 3 days and send me reports through my chart. Stop the losartan as you think it gives you HA.  Monitor for your headache.  Let me know if it continues and this will need to be worked up.

## 2020-02-13 NOTE — Assessment & Plan Note (Signed)
He needs to follow up with Dr.  Heywood Footman  recomendation for renal US -to check for anatomic anomaly. It has already been ordered and he was advised to call them and reschedule.  He voices  agreement.

## 2020-02-13 NOTE — Assessment & Plan Note (Signed)
Ref placed to GI for colonoscopy

## 2020-02-13 NOTE — Progress Notes (Signed)
Virtual Visit via Telephone Note  This visit type was conducted due to national recommendations for restrictions regarding the COVID-19 pandemic (e.g. social distancing).  This format is felt to be most appropriate for this patient at this time.  All issues noted in this document were discussed and addressed.  No physical exam was performed (except for noted visual exam findings with Video Visits).   I connected with@ on 02/13/20 at  8:00 AM EDT by a video enabled telemedicine application or telephone and verified that I am speaking with the correct person using two identifiers. 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 I also discussed with the patient that there may be a patient responsible charge related to this service. The patient expressed understanding and agreed to proceed.  The patient did not have access to video capability.  We continued and completed visit with audio only.   Reason for visit: Follow-up of hypertension.  He thinks losartan may be giving him headaches.  HPI: Joseph Burch is a 49 year old patient with diabetes and hypertension.  He is following up by telephone visit today.  He has been taking his amlodipine 5 mg daily along with losartan 25 mg daily.  He takes losartan in the morning.  He checks his blood pressure 2 hours later and the blood pressure is good at 115/75 and has been mostly under 120/80.  HTN:He checks his  blood pressure about 6PM for several days and found it was higher at 135/85-90.  He does not think the losartan dose is holding him.  He believes he has been getting a headache related to losartan because he did not have headaches until he started the medication.  This is a throbbing type headache at the  top of his head, relieved with ibuprofen 2 tablets daily.  No headaches this weekend.  He has a headache maybe 3 or 4 days out of the week.  He says they are not terrible but they are present.  He does not have a history of migraine, nausea, light sensitivity, blurred vision, visual changes, extremity weakness numbness or stroke type symptoms.    T2DM: He started Metformin XR 500 mg daily in May.  He has been trying to keep the dose to once daily with diet and exercise.  He has lost weight.  He has been referred to diabetes education I do not think he has completed that. FBS 120 -125 and 2 hr PP- 80 -85 .  Wt at home reported as 165 lbs.   Wt Readings from Last 3 Encounters:  02/13/20 165 lb (74.8 kg)  01/23/20 173 lb (78.5 kg)  12/26/19 173 lb 11.2 oz (78.8 kg)   ROS: See pertinent positives and negatives per HPI.  Past Medical History:  Diagnosis Date  . Diabetes mellitus without complication (HCC)   . Hematuria    onset 29 years ago- cause unkown to the patient  . Hypertension     No past surgical history on file.  Family History  Problem Relation Age of Onset  . Alcohol abuse Mother   . Cancer Mother   . COPD Mother   . Mental illness Mother   . Cancer Father   . Diabetes Father   . Hypertension Father   . Stroke Father   . Drug abuse  Sister   . Early death Brother     SOCIAL HX: Former smoker   Current Outpatient Medications:  .  amLODipine (NORVASC) 5 MG tablet, Take 2 tablets (10 mg total) by mouth daily., Disp: 90 tablet, Rfl: 1 .  aspirin EC 81 MG tablet, Take 81 mg by mouth daily. Swallow whole., Disp: , Rfl:  .  metFORMIN (GLUCOPHAGE XR) 500 MG 24 hr tablet, Start 500mg  PO qpm., Disp: 90 tablet, Rfl: 1  EXAM:  VITALS per patient if applicable:  GENERAL: Sounds  alert, oriented, and like his usual self.   LUNGS: No  signs of respiratory distress, breathing rate appears normal, CV: no obvious cyanosis  PSYCH/NEURO: pleasant and cooperative, no obvious  depression or anxiety, speech and thought processing grossly intact  ASSESSMENT AND PLAN:  Discussed the following assessment and plan:  Diabetes mellitus (HCC)  CBC with Differential/Platelet, Hemoglobin A1c, Ambulatory referral to Ophthalmology, Lipid panel  He is continue to work hard on his diet and exercise.  He tells me his weight is down to 165 pounds.  He has been exercising.  He is looking forward to seeing if his A1c is lower than 7.  He is still taking Metformin XR 500 mg once daily.  He has not had a dilated eye exam yet, but asked me to set that up for him.  He was given referral to [facility].  Benign essential HTN Treat your hypertension with amlodipine 10 mg daily.  Please check your blood pressure every day for 3 days and send me reports through my chart. Stop the losartan as you think it gives you HA.  Monitor for your headache.  Let me know if it continues and this will need to be worked up.  Screening for prostate cancer - Plan: PSA Colon cancer screening Ref placed to GI for colonoscopy Encounter for HCV screening test for low risk patient - Plan: Hepatitis C antibody Screening for HIV (human immunodeficiency virus) - Plan: HIV Antibody (routine testing w rflx)  Asymptomatic microscopic hematuria He needs to follow up with Dr.  [facility]  recommendation for renal Heywood Footman -to check for anatomic anomaly. It has already been ordered and he was advised to call them and reschedule.  He voices  agreement.   Advised:  For your diabetes, you are on Metformin XR 500 mg daily.  Keep up the dieting and exercising. You have lost weight. Great job! We can check repeat A1c anytime .   You will need a laboratory appointment first thing in the morning to get fasting blood work done.  Please call the office to arrange the lab appt according to your schedule.  For colon cancer screening, I will place a referral into gastroenterology for colonoscopy.  For your dilated eye exam  for diabetes, I have placed referral into [facility].  Please get back with Dr. [facility] regarding setting up a kidney ultrasound to check for any abnormal anatomy to explain the blood in the urine.  Please continue to monitor your blood pressure and pulse and  write it down in a notebook.  Bring it in next month.  We need to see if the amlodipine is going to be strong enough to control your hypertension.  Please get the Covid vaccine.  This takes priority over the other vaccines.  You are also due for tetanus booster, shingles vaccine, and flu shot.  Follow-up office visit in 4 weeks to go over labs, BP and blood sugar.   I  discussed the assessment and treatment plan with the patient. The patient was provided an opportunity to ask questions and all were answered. The patient agreed with the plan and demonstrated an understanding of the instructions.   The patient was advised to call back or seek an in-person evaluation if the symptoms worsen or if the condition fails to improve as anticipated.  I provided 22 minutes of non-face-to-face time during this encounter.

## 2020-02-14 ENCOUNTER — Telehealth: Payer: Self-pay | Admitting: Nurse Practitioner

## 2020-02-14 NOTE — Telephone Encounter (Signed)
Rejection Reason - Patient Declined" [facility] said 15 minutes ago

## 2020-03-09 ENCOUNTER — Telehealth: Payer: Self-pay | Admitting: Nurse Practitioner

## 2020-03-09 DIAGNOSIS — Z1211 Encounter for screening for malignant neoplasm of colon: Secondary | ICD-10-CM

## 2020-03-09 NOTE — Telephone Encounter (Signed)
Rejection Reason - Patient Declined" [facility] said 1 day ago  Pt wanted to do cologuard

## 2020-03-12 NOTE — Addendum Note (Signed)
Addended by: Amedeo Kinsman A on: 03/12/2020 03:09 PM   Modules accepted: Orders

## 2020-03-12 NOTE — Telephone Encounter (Signed)
Does he have a first degree relative with colon cancer?  If yes, a colonoscopy is the preferred test as [place] is considered to be at higher risk himself and cologuard is for average risk people who have no symptoms.

## 2020-03-12 NOTE — Telephone Encounter (Signed)
Unable to leave message for patient to return call back. VM Box is full.

## 2020-03-12 NOTE — Telephone Encounter (Signed)
Order for Cologuard has been placed and sent

## 2020-03-12 NOTE — Telephone Encounter (Signed)
Patient stated he wants cologuard, no family hx of colon cancer.

## 2020-11-03 ENCOUNTER — Encounter: Payer: Self-pay | Admitting: Nurse Practitioner

## 2020-11-03 DIAGNOSIS — E785 Hyperlipidemia, unspecified: Secondary | ICD-10-CM | POA: Insufficient documentation

## 2020-11-03 DIAGNOSIS — E1169 Type 2 diabetes mellitus with other specified complication: Secondary | ICD-10-CM | POA: Insufficient documentation

## 2020-11-07 ENCOUNTER — Ambulatory Visit (INDEPENDENT_AMBULATORY_CARE_PROVIDER_SITE_OTHER): Payer: 59 | Admitting: Nurse Practitioner

## 2020-11-07 ENCOUNTER — Other Ambulatory Visit: Payer: Self-pay

## 2020-11-07 ENCOUNTER — Encounter: Payer: Self-pay | Admitting: Nurse Practitioner

## 2020-11-07 VITALS — BP 146/88 | HR 62 | Temp 97.6°F | Ht 65.5 in | Wt 178.6 lb

## 2020-11-07 DIAGNOSIS — R809 Proteinuria, unspecified: Secondary | ICD-10-CM

## 2020-11-07 DIAGNOSIS — E1129 Type 2 diabetes mellitus with other diabetic kidney complication: Secondary | ICD-10-CM | POA: Diagnosis not present

## 2020-11-07 DIAGNOSIS — E1169 Type 2 diabetes mellitus with other specified complication: Secondary | ICD-10-CM

## 2020-11-07 DIAGNOSIS — Z7689 Persons encountering health services in other specified circumstances: Secondary | ICD-10-CM | POA: Diagnosis not present

## 2020-11-07 DIAGNOSIS — M79642 Pain in left hand: Secondary | ICD-10-CM | POA: Insufficient documentation

## 2020-11-07 DIAGNOSIS — R3121 Asymptomatic microscopic hematuria: Secondary | ICD-10-CM

## 2020-11-07 DIAGNOSIS — Z6827 Body mass index (BMI) 27.0-27.9, adult: Secondary | ICD-10-CM | POA: Insufficient documentation

## 2020-11-07 DIAGNOSIS — M25511 Pain in right shoulder: Secondary | ICD-10-CM | POA: Insufficient documentation

## 2020-11-07 DIAGNOSIS — J309 Allergic rhinitis, unspecified: Secondary | ICD-10-CM | POA: Insufficient documentation

## 2020-11-07 DIAGNOSIS — I152 Hypertension secondary to endocrine disorders: Secondary | ICD-10-CM

## 2020-11-07 DIAGNOSIS — E785 Hyperlipidemia, unspecified: Secondary | ICD-10-CM

## 2020-11-07 DIAGNOSIS — E1159 Type 2 diabetes mellitus with other circulatory complications: Secondary | ICD-10-CM | POA: Diagnosis not present

## 2020-11-07 DIAGNOSIS — Z6829 Body mass index (BMI) 29.0-29.9, adult: Secondary | ICD-10-CM

## 2020-11-07 DIAGNOSIS — J301 Allergic rhinitis due to pollen: Secondary | ICD-10-CM

## 2020-11-07 MED ORDER — CVS LANCETS THIN 26G MISC: 4 refills | Status: AC

## 2020-11-07 MED ORDER — AMLODIPINE BESYLATE 5 MG PO TABS: 10.0000 mg | ORAL_TABLET | Freq: Every day | ORAL | 4 refills | Status: DC

## 2020-11-07 MED ORDER — METFORMIN HCL ER 500 MG PO TB24: 500.0000 mg | ORAL_TABLET | Freq: Two times a day (BID) | ORAL | 4 refills | Status: DC

## 2020-11-07 MED ORDER — CVS GLUCOSE METER TEST STRIPS VI STRP: ORAL_STRIP | 12 refills | Status: AC

## 2020-11-07 NOTE — Assessment & Plan Note (Signed)
Noted elevation in LDL at 106 on last labs, discussed use of statin in diabetes, wishes to think about this.  Plan on lipid panel next visit and continue to educate/recommend statin use.  Return in 4 weeks.

## 2020-11-07 NOTE — Patient Instructions (Signed)
-- Take Tylenol, Voltaren gel, or Icy/Hot Lidocaine patches.  Try to avoid Ibuprofen.   Diabetes Mellitus and Nutrition, Adult When you have diabetes, or diabetes mellitus, it is very important to have healthy eating habits because your blood sugar (glucose) levels are greatly affected by what you eat and drink. Eating healthy foods in the right amounts, at about the same times every day, can help you:  Control your blood glucose.  Lower your risk of heart disease.  Improve your blood pressure.  Reach or maintain a healthy weight. What can affect my meal plan? Every person with diabetes is different, and each person has different needs for a meal plan. Your health care provider may recommend that you work with a dietitian to make a meal plan that is best for you. Your meal plan may vary depending on factors such as:  The calories you need.  The medicines you take.  Your weight.  Your blood glucose, blood pressure, and cholesterol levels.  Your activity level.  Other health conditions you have, such as heart or kidney disease. How do carbohydrates affect me? Carbohydrates, also called carbs, affect your blood glucose level more than any other type of food. Eating carbs naturally raises the amount of glucose in your blood. Carb counting is a method for keeping track of how many carbs you eat. Counting carbs is important to keep your blood glucose at a healthy level, especially if you use insulin or take certain oral diabetes medicines. It is important to know how many carbs you can safely have in each meal. This is different for every person. Your dietitian can help you calculate how many carbs you should have at each meal and for each snack. How does alcohol affect me? Alcohol can cause a sudden decrease in blood glucose (hypoglycemia), especially if you use insulin or take certain oral diabetes medicines. Hypoglycemia can be a life-threatening condition. Symptoms of hypoglycemia, such  as sleepiness, dizziness, and confusion, are similar to symptoms of having too much alcohol.  Do not drink alcohol if: ? Your health care provider tells you not to drink. ? You are pregnant, may be pregnant, or are planning to become pregnant.  If you drink alcohol: ? Do not drink on an empty stomach. ? Limit how much you use to:  0-1 drink a day for women.  0-2 drinks a day for men. ? Be aware of how much alcohol is in your drink. In the [place] one drink equals one 12 oz bottle of beer (355 mL), one 5 oz glass of wine (148 mL), or one 1 oz glass of hard liquor (44 mL). ? Keep yourself hydrated with water, diet soda, or unsweetened iced tea.  Keep in mind that regular soda, juice, and other mixers may contain a lot of sugar and must be counted as carbs. What are tips for following this plan? Reading food labels  Start by checking the serving size on the "Nutrition Facts" label of packaged foods and drinks. The amount of calories, carbs, fats, and other nutrients listed on the label is based on one serving of the item. Many items contain more than one serving per package.  Check the total grams (g) of carbs in one serving. You can calculate the number of servings of carbs in one serving by dividing the total carbs by 15. For example, if a food has 30 g of total carbs per serving, it would be equal to 2 servings of carbs.  Check the number  of grams (g) of saturated fats and trans fats in one serving. Choose foods that have a low amount or none of these fats.  Check the number of milligrams (mg) of salt (sodium) in one serving. Most people should limit total sodium intake to less than 2,300 mg per day.  Always check the nutrition information of foods labeled as "low-fat" or "nonfat." These foods may be higher in added sugar or refined carbs and should be avoided.  Talk to your dietitian to identify your daily goals for nutrients listed on the label. Shopping  Avoid buying canned,  pre-made, or processed foods. These foods tend to be high in fat, sodium, and added sugar.  Shop around the outside edge of the grocery store. This is where you will most often find fresh fruits and vegetables, bulk grains, fresh meats, and fresh dairy. Cooking  Use low-heat cooking methods, such as baking, instead of high-heat cooking methods like deep frying.  Cook using healthy oils, such as olive, canola, or sunflower oil.  Avoid cooking with butter, cream, or high-fat meats. Meal planning  Eat meals and snacks regularly, preferably at the same times every day. Avoid going long periods of time without eating.  Eat foods that are high in fiber, such as fresh fruits, vegetables, beans, and whole grains. Talk with your dietitian about how many servings of carbs you can eat at each meal.  Eat 4-6 oz (112-168 g) of lean protein each day, such as lean meat, chicken, fish, eggs, or tofu. One ounce (oz) of lean protein is equal to: ? 1 oz (28 g) of meat, chicken, or fish. ? 1 egg. ?  cup (62 g) of tofu.  Eat some foods each day that contain healthy fats, such as avocado, nuts, seeds, and fish.   What foods should I eat? Fruits Berries. Apples. Oranges. Peaches. Apricots. Plums. Grapes. Mango. Papaya. Pomegranate. Kiwi. Cherries. Vegetables Lettuce. Spinach. Leafy greens, including kale, chard, collard greens, and mustard greens. Beets. Cauliflower. Cabbage. Broccoli. Carrots. Green beans. Tomatoes. Peppers. Onions. Cucumbers. Brussels sprouts. Grains Whole grains, such as whole-wheat or whole-grain bread, crackers, tortillas, cereal, and pasta. Unsweetened oatmeal. Quinoa. Brown or wild rice. Meats and other proteins Seafood. Poultry without skin. Lean cuts of poultry and beef. Tofu. Nuts. Seeds. Dairy Low-fat or fat-free dairy products such as milk, yogurt, and cheese. The items listed above may not be a complete list of foods and beverages you can eat. Contact a dietitian for more  information. What foods should I avoid? Fruits Fruits canned with syrup. Vegetables Canned vegetables. Frozen vegetables with butter or cream sauce. Grains Refined white flour and flour products such as bread, pasta, snack foods, and cereals. Avoid all processed foods. Meats and other proteins Fatty cuts of meat. Poultry with skin. Breaded or fried meats. Processed meat. Avoid saturated fats. Dairy Full-fat yogurt, cheese, or milk. Beverages Sweetened drinks, such as soda or iced tea. The items listed above may not be a complete list of foods and beverages you should avoid. Contact a dietitian for more information. Questions to ask a health care provider  Do I need to meet with a diabetes educator?  Do I need to meet with a dietitian?  What number can I call if I have questions?  When are the best times to check my blood glucose? Where to find more information:  American Diabetes Association: diabetes.org  Academy of Nutrition and Dietetics: www.eatright.[employer] of Diabetes and Digestive and Kidney Diseases: CarFlippers.tn  Association  of Diabetes Care and Education Specialists: www.diabeteseducator.org Summary  It is important to have healthy eating habits because your blood sugar (glucose) levels are greatly affected by what you eat and drink.  A healthy meal plan will help you control your blood glucose and maintain a healthy lifestyle.  Your health care provider may recommend that you work with a dietitian to make a meal plan that is best for you.  Keep in mind that carbohydrates (carbs) and alcohol have immediate effects on your blood glucose levels. It is important to count carbs and to use alcohol carefully. This information is not intended to replace advice given to you by your health care provider. Make sure you discuss any questions you have with your health care provider. Document Revised: 05/24/2019 Document Reviewed: 05/24/2019 Elsevier  Patient Education  2021 [employer].

## 2020-11-07 NOTE — Assessment & Plan Note (Signed)
BMI 29.27.  Recommended eating smaller high protein, low fat meals more frequently and exercising 30 mins a day 5 times a week with a goal of 10-15lb weight loss in the next 3 months. Patient voiced their understanding and motivation to adhere to these recommendations.

## 2020-11-07 NOTE — Assessment & Plan Note (Signed)
Acute and improving.  He does not wish to have imaging.  Prefers simple treatment, which is appropriate at this time.  Recommend stopping Ibuprofen due to risk to kidneys and elevation BP with frequent use of this, educated him on this. Start Tylenol as needed, per dosing on package, and may use OTC Icy/Hot lidocaine patches and Voltaren gel.  Alternate heat and ice as needed.  Return for worsening or ongoing -- if present consider imaging and PT.

## 2020-11-07 NOTE — Progress Notes (Signed)
New Patient Office Visit  Subjective:  Patient ID: Joseph Burch, male    DOB: 1971/01/21  Age: 50 y.o. MRN: [id number]  CC:  Chief Complaint  Patient presents with  . Diabetes  . Medication Management    Patient states he has been without his medications for at least 3 months. 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   . Hypertension  . Hand Injury    Patient states he was using a machine operated tool and caused an injury to his left thumb and right shoulder and states it happened about 2 weeks ago.   . Establish Care    Patient is here to establish care. Patient states he was a patient of [facility] and he left their office due to his primary care physician leaving the practice.   . Fatigue  . Medication Refill  . Allergies    Patient thinks that he may have allergies, but not quite sure. He noticed when he works in the yard he will have the feeling of something getting caught in the back of his throat and causes a coughing fit.    HPI Joseph Burch presents for new patient visit to establish care.  Introduced to Publishing rights manager role and practice setting.  All questions answered.  Discussed provider/patient relationship and expectations.  He was previously followed by NP Arvilla Market at [facility].  Has history of asymptomatic hematuria, has been seen by urology (Dr. Lonna Cobb).  Last seen 12/26/19.  Was recommended to have renal ultrasound and cystoscopy -- he reports he had these done.  He agreed to nephrology referral -- reports going to see them.  They could find nothing, per his report, on all testing.  States this has been an issue since teen years and nothing ever found.  DIABETES Recent A1c at DOT physical was 9.3% = and needs assist with this. Last A1c 7.7% in May 2021.  Has been without medication for 3 months and paid out of pocket for some as needed it for DOT.  Was taking Metformin XR 500 MG BID -- the  regular tablets gave him GI issues.  They did certify him for a one year DOT.  Diagnosed September 2020 with diabetes = 7.5% A1c at time.    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 Hypoglycemic episodes:no Polydipsia/polyuria: no Visual disturbance: no Chest pain: no Paresthesias: no Glucose Monitoring: no  Accucheck frequency: Not Checking  Fasting glucose:  Post prandial:  Evening:  Before meals: Taking Insulin?: no  Long acting insulin:  Short acting insulin: Blood Pressure Monitoring: a few times a week Retinal Examination: done at DOT physical, but no official Foot Exam: Up to Date Diabetic Education: Not Completed Pneumovax: will think about this. Influenza: refused Aspirin: yes   HYPERTENSION / HYPERLIPIDEMIA Continues on Amlodipine 10 MG -- has missed doses recently.  Is a past smoker, quit 9 years ago -- smoked 1 to 1/2 PPD.  Has taken Losartan in past -- made him feel loopy. Satisfied with current treatment? 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  Shoulder is noticed more at night, dull/achy pain.   Has gotten better since he did it. Duration: weeks Involved hand: right Mechanism of injury: trauma Location: dorsal Onset: gradual Severity: 8/10  Quality: dull, aching and throbbing Frequency: intermittent Radiation: no Aggravating factors: movement Alleviating factors: Ibuprofen Treatments attempted:  Relief with NSAIDs?: moderate Weakness: yes Numbness: no Redness: no Swelling:no Bruising: no Fevers: no    ALLERGIES Struggles with this outside and is an outside person, does not like taking lots of medications. Duration: chronic Symptoms occur seasonally: yes Symptoms occur perenially: no Satisfied with current treatment: no Allergist evaluation in past: no Allergen injection immunotherapy: no Recurrent sinus infections: no ENT evaluation in past: no Known environmental allergy: no Indoor pets: no History of asthma: no Current allergy medications: none Treatments attempted: none  Past Medical History:  Diagnosis Date  . Diabetes mellitus without complication (HCC)   . Hematuria    onset 29 years ago- cause unkown to the patient  . Hypertension     Past Surgical History:  Procedure Laterality Date  . VASECTOMY      Family History  Problem Relation Age of Onset  . Alcohol abuse Mother   . Cancer Mother   . COPD Mother   . Mental illness Mother   . Cancer Father   . Diabetes Father   . Hypertension Father   . Drug abuse Sister   . Early death Brother     Social History   Socioeconomic History  . Marital status: Married    Spouse name: Not on file  . Number of children: Not on file  . Years of education: Not on file  . Highest education level: Associate degree: occupational, Scientist, product/process development, or vocational program  Occupational History  . Occupation: Truck Hospital doctor  Tobacco Use  . Smoking status: Former Smoker    Types: E-cigarettes, Cigarettes    Quit date: 11/05/2011    Years since quitting: 9.0  . Smokeless tobacco: Never Used  Vaping Use  . Vaping Use: Never used  Substance and Sexual Activity  . Alcohol use: Yes    Comment: rare beer 1-2 per summer  . Drug use: Never  . Sexual activity: Yes  Other Topics Concern  . Not on file  Social History Narrative   Married lives with wife and children x 4.    Social Determinants of Health   Financial Resource Strain: Low Risk   . Difficulty of Paying Living Expenses: Not very hard  Food Insecurity: No Food  Insecurity  . Worried About Programme researcher, broadcasting/film/video in the Last Year: Never true  . Ran Out of Food in the Last Year: Never true  Transportation Needs: No Transportation Needs  . Lack of Transportation (Medical): No  . Lack of Transportation (Non-Medical): No  Physical Activity: Sufficiently Active  . Days of Exercise per Week: 4 days  . Minutes of Exercise per Session: 50 min  Stress: No Stress Concern Present  . Feeling of Stress : Only a little  Social Connections: Moderately Integrated  . Frequency of Communication with Friends and Family: More than three times a week  . Frequency of Social Gatherings with Friends and Family: More than three times a week  . Attends Religious Services: 1 to 4 times per year  . Active Member of Clubs or Organizations: No  . Attends Banker Meetings: Never  . Marital Status: Married  Intimate Partner Violence: Not on file    ROS Review of Systems  Constitutional: Negative for activity change, diaphoresis, fatigue and fever.  Respiratory: Negative for cough, chest tightness, shortness of breath and wheezing.   Cardiovascular: Negative for chest pain, palpitations and leg swelling.  Gastrointestinal: Negative.   Endocrine: Negative for cold intolerance, heat intolerance, polydipsia, polyphagia and polyuria.  Neurological: Negative.   Psychiatric/Behavioral: Negative.     Objective:   Today's Vitals: BP (!) 146/88   Pulse 62   Temp 97.6 F (36.4 C) (Oral)   Ht 5' 5.5" (1.664 m)   Wt 178 lb 9.6 oz (81 kg)   SpO2 96%   BMI 29.27 kg/m   Physical Exam Vitals and nursing note reviewed.  Constitutional:      General: He is awake. He is not in acute distress.    Appearance: He is well-developed, well-groomed and overweight. He is not ill-appearing or toxic-appearing.  HENT:     Head: Normocephalic and atraumatic.     Right Ear: Hearing normal. No drainage.     Left Ear: Hearing normal. No drainage.  Eyes:     General: Lids are  normal.        Right eye: No discharge.        Left eye: No discharge.     Conjunctiva/sclera: Conjunctivae normal.     Pupils: Pupils are equal, round, and reactive to light.  Neck:     Thyroid: No thyromegaly.     Vascular: No carotid bruit.     Trachea: Trachea normal.  Cardiovascular:     Rate and Rhythm: Normal rate and regular rhythm.     Heart sounds: Normal heart sounds, S1 normal and S2 normal. No murmur heard. No gallop.   Pulmonary:     Effort: Pulmonary effort is normal. No accessory muscle usage or respiratory distress.     Breath sounds: Normal breath sounds.  Abdominal:     General: Bowel sounds are normal.     Palpations: Abdomen is soft.  Musculoskeletal:     Right shoulder: Normal.     Left shoulder: Normal.     Right hand: Normal.     Left hand: No swelling or tenderness. Decreased range of motion (mild). Normal strength. Normal sensation.     Cervical back: Normal range of motion and neck supple.     Right lower leg: No edema.     Left lower leg: No edema.  Lymphadenopathy:     Cervical: No cervical adenopathy.  Skin:    General: Skin is warm and dry.     Capillary Refill: Capillary refill takes less than 2 seconds.  Neurological:     Mental Status: He is alert and oriented to person, place, and time.     Deep Tendon Reflexes: Reflexes are normal and symmetric.     Reflex Scores:      Brachioradialis reflexes are 2+ on the right side and 2+ on the left side.      Patellar reflexes are 2+ on the right side and 2+ on the left side. Psychiatric:        Attention and Perception: Attention normal.        Mood and Affect: Mood normal.        Speech: Speech normal.        Behavior: Behavior normal. Behavior is cooperative.        Thought Content: Thought content normal.    Assessment & Plan:   Problem List Items Addressed  This Visit      Cardiovascular and Mediastinum   Hypertension associated with diabetes (HCC)    Chronic, ongoing, poor control at  this time due to missing medication and being lost to follow-up after his provider left.  Did not tolerate ARB in past, although would benefit from ACE or ARB with his diabetes, consider low dose ACE trial in future.  Recommend he monitor BP at least a few mornings a week at home and document.  DASH diet at home.  Continue current medication regimen and adjust as needed, refills on Amlodipine sent.  Labs next visit: TSH, urine ALB, CBC, CMP.  Return in 6 months.       Relevant Medications   amLODipine (NORVASC) 5 MG tablet   metFORMIN (GLUCOPHAGE XR) 500 MG 24 hr tablet     Respiratory   Allergic rhinitis    Ongoing, no current medications.  Recommend he trial OTC Claritin when he knows he will be performing outdoor activities during trigger seasons.  May also trial OTC Flonase as needed.  If worsening or ongoing then return to office.        Endocrine   Type 2 diabetes mellitus with proteinuria (HCC)    Chronic, ongoing.  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   - At this time will send refills in on Metformin XR 500 MG BID, has not tried higher dosing and did not tolerate regular tablets.    - Educated him on alternatives if alternate medications needed, if A1c remains >7% next check, such as SGLT 2 and GLP 1 medications.  He is interested in SGLT 2, prefers not to use injectable unless needed.  Will consider this is ongoing elevations (possible Marcelline Deist - may be covered on review).   - Consider CCM referral in future.   - New strips and lancets sent in.   - Recommend he monitor BS 2-3 times daily with goal fasting <130 and goal 2 hours post meal <180.  Document and bring to visits.   Return in 4 weeks, will obtain labs -- A1c in office and urine ALB.      Relevant Medications   metFORMIN (GLUCOPHAGE XR) 500 MG 24 hr tablet   Hyperlipidemia associated  with type 2 diabetes mellitus (HCC)    Noted elevation in LDL at 106 on last labs, discussed use of statin in diabetes, wishes to think about this.  Plan on lipid panel next visit and continue to educate/recommend statin use.  Return in 4 weeks.      Relevant Medications   metFORMIN (GLUCOPHAGE XR) 500 MG 24 hr tablet     Genitourinary   Asymptomatic microscopic hematuria    Ongoing since younger years with negative work-up per patient.  Continue to monitor, consider return to urology in future for ongoing assessment.        Other   Right shoulder pain    Acute and improving.  He does not wish to have a muscle relaxer script sent in or imaging.  Prefers simple treatment, which is appropriate at this time.  Recommend stopping Ibuprofen due to risk to kidneys and elevation BP with frequent use of this, educated him on this. Start Tylenol as needed, per dosing on package, and may use OTC Icy/Hot lidocaine patches and Voltaren gel.  Alternate heat and ice as needed.  Return for  worsening or ongoing -- if present consider imaging and PT.      Left hand pain    Acute and improving.  He does not wish to have imaging.  Prefers simple treatment, which is appropriate at this time.  Recommend stopping Ibuprofen due to risk to kidneys and elevation BP with frequent use of this, educated him on this. Start Tylenol as needed, per dosing on package, and may use OTC Icy/Hot lidocaine patches and Voltaren gel.  Alternate heat and ice as needed.  Return for worsening or ongoing -- if present consider imaging and PT.      BMI 29.0-29.9,adult    BMI 29.27.  Recommended eating smaller high protein, low fat meals more frequently and exercising 30 mins a day 5 times a week with a goal of 10-15lb weight loss in the next 3 months. Patient voiced their understanding and motivation to adhere to these recommendations.        Other Visit Diagnoses    Encounter to establish care    -  Primary      Outpatient  Encounter Medications as of 11/07/2020  Medication Sig  . [facility] Lancets Thin 26G MISC To check blood sugar 2-3 times daily.  Marland Kitchen. glucose blood ([facility] GLUCOSE METER TEST STRIPS) test strip To check blood sugar 2-3 times daily.  Marland Kitchen. amLODipine (NORVASC) 5 MG tablet Take 2 tablets (10 mg total) by mouth daily.  Marland Kitchen. aspirin EC 81 MG tablet Take 81 mg by mouth daily. Swallow whole.  . metFORMIN (GLUCOPHAGE XR) 500 MG 24 hr tablet Take 1 tablet (500 mg total) by mouth 2 (two) times daily.  . [DISCONTINUED] amLODipine (NORVASC) 5 MG tablet Take 2 tablets (10 mg total) by mouth daily. (Patient not taking: Reported on 11/07/2020)  . [DISCONTINUED] metFORMIN (GLUCOPHAGE XR) 500 MG 24 hr tablet Start 500mg  PO qpm. (Patient not taking: Reported on 11/07/2020)   No facility-administered encounter medications on file as of 11/07/2020.    Follow-up: Return in about 4 weeks (around 12/05/2020) for T2DM.   Marjie SkiffJOLENE T Xander Jutras, NP

## 2020-11-07 NOTE — Assessment & Plan Note (Signed)
Chronic, ongoing, poor control at this time due to missing medication and being lost to follow-up after his provider left.  Did not tolerate ARB in past, although would benefit from ACE or ARB with his diabetes, consider low dose ACE trial in future.  Recommend he monitor BP at least a few mornings a week at home and document.  DASH diet at home.  Continue current medication regimen and adjust as needed, refills on Amlodipine sent.  Labs next visit: TSH, urine ALB, CBC, CMP.  Return in 6 months.

## 2020-11-07 NOTE — Assessment & Plan Note (Signed)
Ongoing, no current medications.  Recommend he trial OTC Claritin when he knows he will be performing outdoor activities during trigger seasons.  May also trial OTC Flonase as needed.  If worsening or ongoing then return to office.

## 2020-11-07 NOTE — Assessment & Plan Note (Addendum)
Chronic, ongoing.  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   - At this time will send refills in on Metformin XR 500 MG BID, has not tried higher dosing and did not tolerate regular tablets.    - Educated him on alternatives if alternate medications needed, if A1c remains >7% next check, such as SGLT 2 and GLP 1 medications.  He is interested in SGLT 2, prefers not to use injectable unless needed.  Will consider this is ongoing elevations (possible Marcelline Deist - may be covered on review).   - Consider CCM referral in future.   - New strips and lancets sent in.   - Recommend he monitor BS 2-3 times daily with goal fasting <130 and goal 2 hours post meal <180.  Document and bring to visits.   Return in 4 weeks, will obtain labs -- A1c in office and urine ALB.

## 2020-11-07 NOTE — Assessment & Plan Note (Signed)
Ongoing since younger years with negative work-up per patient.  Continue to monitor, consider return to urology in future for ongoing assessment.

## 2020-11-07 NOTE — Assessment & Plan Note (Signed)
Acute and improving.  He does not wish to have a muscle relaxer script sent in or imaging.  Prefers simple treatment, which is appropriate at this time.  Recommend stopping Ibuprofen due to risk to kidneys and elevation BP with frequent use of this, educated him on this. Start Tylenol as needed, per dosing on package, and may use OTC Icy/Hot lidocaine patches and Voltaren gel.  Alternate heat and ice as needed.  Return for worsening or ongoing -- if present consider imaging and PT.

## 2020-11-23 ENCOUNTER — Ambulatory Visit: Payer: Self-pay | Admitting: *Deleted

## 2020-11-23 MED ORDER — DAPAGLIFLOZIN PROPANEDIOL 5 MG PO TABS: 5.0000 mg | ORAL_TABLET | Freq: Every day | ORAL | 12 refills | Status: DC

## 2020-11-23 NOTE — Telephone Encounter (Signed)
He reports no issues with medication, just occasionally the top of his head and neck get hot.  Currently taking 1000 MG Metformin XR BID = at this time his blood sugars in morning are 130, then eats and 2 hours after 180-200.  Has cut out sodas all together -- praised for this.  Discussed his goal blood sugars and that he is getting close to goal and appears getting at goal in morning.  He would like to try to add on an SGLT 2 medication, will send in a low dose of Farxiga 5 MG daily (30 day supply) which appears to be covered.  If any issues he is to alert provider.  Again reiterated goal of <130 fasting and <180 2 hours after meal.  To reach out if consistent low <70 or highs >300.  He was able to verbalize this back.

## 2020-11-23 NOTE — Addendum Note (Signed)
Addended by: Aura Dials T on: 11/23/2020 03:09 PM   Modules accepted: Orders

## 2020-11-23 NOTE — Telephone Encounter (Signed)
Pt seen on 11/07/2020 and established care with Aura Dials, NP at [facility].  He had been off of his medications so Jolene is getting him restarted on them.  He is having side effects from the metformin plus his glucose is staying elevated.   His head and neck are hot and he is having trouble thinking clearly.   "I just feel off".    His glucose is staying 120-130 before he eats and that's taking 2 500 mg metformin pills in the morning and 2 500 mg pills in the afternoon.    30 minutes after eating his glucose will be 180-200.   "That just seems too high".   He is requesting for further instructions on what he should be doing to get his glucose to come down.    He was agreeable to someone calling him back.   [phone].  I sent my notes to Aura Dials, NP high priority to [facility].       Reason for Disposition . [1] Caller has URGENT medication or insulin pump question AND [2] triager unable to answer question  Answer Assessment - Initial Assessment Questions 1. BLOOD GLUCOSE: "What is your blood glucose level?"      I got DOT physical.   A1C is high.  Saw Jolene Cannady  On metformin again.   I was seeing another provider but she left the practice.   I'm new with Jolene. I can't get my blood sugar below 130 with 2 pills in am and in the PM.  Each pill 500 mg.    I having side effects with the metformin.   My head is hot and my neck.   I feel "off".     He is also on amlodipine but it doesn't bother him.    2. ONSET: "When did you check the blood glucose?"     133 just now before lunch.   After lunch it will be 180-200.    Breakfast at 9:30 and glucose 133 now.    3. USUAL RANGE: "What is your glucose level usually?" (e.g., usual fasting morning value, usual evening value)     120-130 with taking 1,000 mg metformin in the morning and 1,000 mg metformin in the evening/night 4. KETONES: "Do you check for ketones (urine or blood test strips)?" If yes,  ask: "What does the test show now?"      No 5. TYPE 1 or 2:  "Do you know what type of diabetes you have?"  (e.g., Type 1, Type 2, Gestational; doesn't know)      Type 2 6. INSULIN: "Do you take insulin?" "What type of insulin(s) do you use? What is the mode of delivery? (syringe, pen; injection or pump)?"      No 7. DIABETES PILLS: "Do you take any pills for your diabetes?" If yes, ask: "Have you missed taking any pills recently?"     Metformin  No.   I'm taking 2 pills (500 mg each) in the morning and 2 at night 8. OTHER SYMPTOMS: "Do you have any symptoms?" (e.g., fever, frequent urination, difficulty breathing, dizziness, weakness, vomiting)     Just feeling "off".   "Can't think clearly".   "My head feels hot along with my neck". He is also on amlodipine but he has taken it before and it did not bother him so does not think it's the amlodipine causing any of his symptoms but the metformin. 9. PREGNANCY: "Is there any chance you are pregnant?" "  When was your last menstrual period?"     N/A  Protocols used: DIABETES - HIGH BLOOD SUGAR-A-AH

## 2020-12-07 ENCOUNTER — Other Ambulatory Visit: Payer: Self-pay

## 2020-12-07 ENCOUNTER — Telehealth: Payer: Self-pay

## 2020-12-07 ENCOUNTER — Ambulatory Visit (INDEPENDENT_AMBULATORY_CARE_PROVIDER_SITE_OTHER): Payer: 59 | Admitting: Nurse Practitioner

## 2020-12-07 ENCOUNTER — Encounter: Payer: Self-pay | Admitting: Nurse Practitioner

## 2020-12-07 VITALS — BP 122/84 | HR 73 | Temp 98.1°F | Ht 65.51 in | Wt 173.2 lb

## 2020-12-07 DIAGNOSIS — E1129 Type 2 diabetes mellitus with other diabetic kidney complication: Secondary | ICD-10-CM

## 2020-12-07 DIAGNOSIS — R809 Proteinuria, unspecified: Secondary | ICD-10-CM | POA: Diagnosis not present

## 2020-12-07 MED ORDER — METFORMIN HCL ER 500 MG PO TB24: 1000.0000 mg | ORAL_TABLET | Freq: Two times a day (BID) | ORAL | 4 refills | Status: DC

## 2020-12-07 NOTE — Progress Notes (Signed)
BP 122/84 (BP Location: Left Arm)   Pulse 73   Temp 98.1 F (36.7 C) (Oral)   Ht 5' 5.51" (1.664 m)   Wt 173 lb 3.2 oz (78.6 kg)   SpO2 98%   BMI 28.37 kg/m    Subjective:    Patient ID: Joseph Burch, male    DOB: 08-13-1970, 49 y.o.   MRN: [id number]  HPI: Joseph Burch is a 50 y.o. male  Chief Complaint  Patient presents with   Diabetes   DIABETES Follow-up for diabetes.  Recent A1c at DOT physical was 9.3%.  Last A1c 7.7% in May 2021.  Had been without medication for 3 months and restarted last visit + added [place] which he is not taking -- cost >$300 for 30 day supply.  Is taking Metformin XR 1000 MG BID -- the regular tablets gave him GI issues. They did certify him for a one year DOT.  Diagnosed September 2020 with diabetes = 7.5% A1c at time.    Has given up Dr. Reino Kent completely.  Drinking water.   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 Hypoglycemic episodes:no Polydipsia/polyuria: no Visual disturbance: no Chest pain: no Paresthesias: no Glucose Monitoring: no             Accucheck frequency: daily             Fasting glucose: 118 to 122             Post prandial:             Evening: 170-180 -- occasionally >200             Before meals: Taking Insulin?: no             Long acting insulin:             Short acting insulin: Blood Pressure Monitoring: a few times a week Retinal Examination: done at DOT physical, but no official Foot Exam: Up to Date Diabetic Education: Not Completed Pneumovax: will think about this. Influenza: refused Aspirin: yes   Relevant past medical, surgical, family and social history reviewed and updated as indicated. Interim medical history since our last visit reviewed. Allergies and medications reviewed and updated.  Review of Systems  Constitutional:  Negative for activity change, diaphoresis, fatigue and fever.  Respiratory:  Negative for cough, chest tightness, shortness of breath and wheezing.    Cardiovascular:  Negative for chest pain, palpitations and leg swelling.  Gastrointestinal: Negative.   Endocrine: Negative for cold intolerance, heat intolerance, polydipsia, polyphagia and polyuria.  Neurological: Negative.   Psychiatric/Behavioral: Negative.     Per HPI unless specifically indicated above     Objective:    BP 122/84 (BP Location: Left Arm)   Pulse 73   Temp 98.1 F (36.7 C) (Oral)   Ht 5' 5.51" (1.664 m)   Wt 173 lb 3.2 oz (78.6 kg)   SpO2 98%   BMI 28.37 kg/m   Wt Readings from Last 3 Encounters:  12/07/20 173 lb 3.2 oz (78.6 kg)  11/07/20 178 lb 9.6 oz (81 kg)  02/13/20 165 lb (74.8 kg)    Physical Exam Vitals and nursing note reviewed.  Constitutional:      General: He is awake. He is not in acute distress.    Appearance: He is well-developed, well-groomed and overweight. He is not ill-appearing or toxic-appearing.  HENT:     Head: Normocephalic and atraumatic.  Right Ear: Hearing normal. No drainage.     Left Ear: Hearing normal. No drainage.  Eyes:     General: Lids are normal.        Right eye: No discharge.        Left eye: No discharge.     Conjunctiva/sclera: Conjunctivae normal.     Pupils: Pupils are equal, round, and reactive to light.  Neck:     Thyroid: No thyromegaly.     Vascular: No carotid bruit.     Trachea: Trachea normal.  Cardiovascular:     Rate and Rhythm: Normal rate and regular rhythm.     Heart sounds: Normal heart sounds, S1 normal and S2 normal. No murmur heard.   No gallop.  Pulmonary:     Effort: Pulmonary effort is normal. No accessory muscle usage or respiratory distress.     Breath sounds: Normal breath sounds.  Abdominal:     General: Bowel sounds are normal.     Palpations: Abdomen is soft.  Musculoskeletal:     Cervical back: Normal range of motion and neck supple.     Right lower leg: No edema.     Left lower leg: No edema.  Lymphadenopathy:     Cervical: No cervical adenopathy.  Skin:     General: Skin is warm and dry.     Capillary Refill: Capillary refill takes less than 2 seconds.  Neurological:     Mental Status: He is alert and oriented to person, place, and time.     Deep Tendon Reflexes: Reflexes are normal and symmetric.     Reflex Scores:      Brachioradialis reflexes are 2+ on the right side and 2+ on the left side.      Patellar reflexes are 2+ on the right side and 2+ on the left side. Psychiatric:        Attention and Perception: Attention normal.        Mood and Affect: Mood normal.        Speech: Speech normal.        Behavior: Behavior normal. Behavior is cooperative.        Thought Content: Thought content normal.    Results for orders placed or performed in visit on 12/26/19  Microscopic Examination   Urine  Result Value Ref Range   WBC, UA 0-5 0 - 5 /hpf   RBC 3-10 (A) 0 - 2 /hpf   Epithelial Cells (non renal) 0-10 0 - 10 /hpf   Bacteria, UA None seen None seen/Few  Urinalysis, Complete  Result Value Ref Range   Specific Gravity, UA 1.025 1.005 - 1.030   pH, UA 6.0 5.0 - 7.5   Color, UA Yellow Yellow   Appearance Ur Clear Clear   Leukocytes,UA Negative Negative   Protein,UA Negative Negative/Trace   Glucose, UA Negative Negative   Ketones, UA Negative Negative   RBC, UA 2+ (A) Negative   Bilirubin, UA Negative Negative   Urobilinogen, Ur 0.2 0.2 - 1.0 mg/dL   Nitrite, UA Negative Negative   Microscopic Examination See below:       Assessment & Plan:   Problem List Items Addressed This Visit       Endocrine   Type 2 diabetes mellitus with proteinuria (HCC) - Primary    Chronic, ongoing.   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   - At this  time will continue Metformin XR 1000 MG BID as sugars appear to be coming own to goal and below goal  - Educated him on alternatives if alternate medications needed, if A1c remains >7% next check, such as SGLT  2 and GLP 1 medications.  He is interested in SGLT 2, prefers not to use injectable unless needed.  Will consider this is ongoing elevations (possible Marcelline Deist - may be covered on review).   - CCM referral to assist with costs and medication adherence  - Recommend he monitor BS 2-3 times daily with goal fasting <130 and goal 2 hours post meal <180.  Document and bring to visits.   Return in August, will obtain labs -- A1c in office and urine ALB.       Relevant Medications   metFORMIN (GLUCOPHAGE XR) 500 MG 24 hr tablet   Other Relevant Orders   AMB Referral to [facility]     Follow up plan: Return in about 2 months (around 02/07/2021).

## 2020-12-07 NOTE — Chronic Care Management (AMB) (Signed)
  Care Management   Outreach Note  12/07/2020 Name: Joseph Burch MRN: [id number] DOB: [birth year]  Referred by: Marjie Skiff, NP Reason for referral : Care Coordination (Outreach to schedule referral with RN CM )   An unsuccessful telephone outreach was attempted today. The patient was referred to the case management team for assistance with care management and care coordination.   Follow Up Plan: The care management team will reach out to the patient again over the next 7 days.  If patient returns call to provider office, please advise to call Embedded Care Management Care Guide Penne Lash at [phone]  Penne Lash, RMA Care Guide, Embedded Care Coordination [facility]  [place], [place] [address] Direct Dial: [phone] [email or web address]

## 2020-12-07 NOTE — Patient Instructions (Signed)
Diabetes Mellitus and Nutrition, Adult When you have diabetes, or diabetes mellitus, it is very important to have healthy eating habits because your blood sugar (glucose) levels are greatly affected by what you eat and drink. Eating healthy foods in the right amounts, at about the same times every day, can help you:  Control your blood glucose.  Lower your risk of heart disease.  Improve your blood pressure.  Reach or maintain a healthy weight. What can affect my meal plan? Every person with diabetes is different, and each person has different needs for a meal plan. Your health care provider may recommend that you work with a dietitian to make a meal plan that is best for you. Your meal plan may vary depending on factors such as:  The calories you need.  The medicines you take.  Your weight.  Your blood glucose, blood pressure, and cholesterol levels.  Your activity level.  Other health conditions you have, such as heart or kidney disease. How do carbohydrates affect me? Carbohydrates, also called carbs, affect your blood glucose level more than any other type of food. Eating carbs naturally raises the amount of glucose in your blood. Carb counting is a method for keeping track of how many carbs you eat. Counting carbs is important to keep your blood glucose at a healthy level, especially if you use insulin or take certain oral diabetes medicines. It is important to know how many carbs you can safely have in each meal. This is different for every person. Your dietitian can help you calculate how many carbs you should have at each meal and for each snack. How does alcohol affect me? Alcohol can cause a sudden decrease in blood glucose (hypoglycemia), especially if you use insulin or take certain oral diabetes medicines. Hypoglycemia can be a life-threatening condition. Symptoms of hypoglycemia, such as sleepiness, dizziness, and confusion, are similar to symptoms of having too much  alcohol.  Do not drink alcohol if: ? Your health care provider tells you not to drink. ? You are pregnant, may be pregnant, or are planning to become pregnant.  If you drink alcohol: ? Do not drink on an empty stomach. ? Limit how much you use to:  0-1 drink a day for women.  0-2 drinks a day for men. ? Be aware of how much alcohol is in your drink. In the [place] one drink equals one 12 oz bottle of beer (355 mL), one 5 oz glass of wine (148 mL), or one 1 oz glass of hard liquor (44 mL). ? Keep yourself hydrated with water, diet soda, or unsweetened iced tea.  Keep in mind that regular soda, juice, and other mixers may contain a lot of sugar and must be counted as carbs. What are tips for following this plan? Reading food labels  Start by checking the serving size on the "Nutrition Facts" label of packaged foods and drinks. The amount of calories, carbs, fats, and other nutrients listed on the label is based on one serving of the item. Many items contain more than one serving per package.  Check the total grams (g) of carbs in one serving. You can calculate the number of servings of carbs in one serving by dividing the total carbs by 15. For example, if a food has 30 g of total carbs per serving, it would be equal to 2 servings of carbs.  Check the number of grams (g) of saturated fats and trans fats in one serving. Choose foods that have   a low amount or none of these fats.  Check the number of milligrams (mg) of salt (sodium) in one serving. Most people should limit total sodium intake to less than 2,300 mg per day.  Always check the nutrition information of foods labeled as "low-fat" or "nonfat." These foods may be higher in added sugar or refined carbs and should be avoided.  Talk to your dietitian to identify your daily goals for nutrients listed on the label. Shopping  Avoid buying canned, pre-made, or processed foods. These foods tend to be high in fat, sodium, and added  sugar.  Shop around the outside edge of the grocery store. This is where you will most often find fresh fruits and vegetables, bulk grains, fresh meats, and fresh dairy. Cooking  Use low-heat cooking methods, such as baking, instead of high-heat cooking methods like deep frying.  Cook using healthy oils, such as olive, canola, or sunflower oil.  Avoid cooking with butter, cream, or high-fat meats. Meal planning  Eat meals and snacks regularly, preferably at the same times every day. Avoid going long periods of time without eating.  Eat foods that are high in fiber, such as fresh fruits, vegetables, beans, and whole grains. Talk with your dietitian about how many servings of carbs you can eat at each meal.  Eat 4-6 oz (112-168 g) of lean protein each day, such as lean meat, chicken, fish, eggs, or tofu. One ounce (oz) of lean protein is equal to: ? 1 oz (28 g) of meat, chicken, or fish. ? 1 egg. ?  cup (62 g) of tofu.  Eat some foods each day that contain healthy fats, such as avocado, nuts, seeds, and fish.   What foods should I eat? Fruits Berries. Apples. Oranges. Peaches. Apricots. Plums. Grapes. Mango. Papaya. Pomegranate. Kiwi. Cherries. Vegetables Lettuce. Spinach. Leafy greens, including kale, chard, collard greens, and mustard greens. Beets. Cauliflower. Cabbage. Broccoli. Carrots. Green beans. Tomatoes. Peppers. Onions. Cucumbers. Brussels sprouts. Grains Whole grains, such as whole-wheat or whole-grain bread, crackers, tortillas, cereal, and pasta. Unsweetened oatmeal. Quinoa. Brown or wild rice. Meats and other proteins Seafood. Poultry without skin. Lean cuts of poultry and beef. Tofu. Nuts. Seeds. Dairy Low-fat or fat-free dairy products such as milk, yogurt, and cheese. The items listed above may not be a complete list of foods and beverages you can eat. Contact a dietitian for more information. What foods should I avoid? Fruits Fruits canned with  syrup. Vegetables Canned vegetables. Frozen vegetables with butter or cream sauce. Grains Refined white flour and flour products such as bread, pasta, snack foods, and cereals. Avoid all processed foods. Meats and other proteins Fatty cuts of meat. Poultry with skin. Breaded or fried meats. Processed meat. Avoid saturated fats. Dairy Full-fat yogurt, cheese, or milk. Beverages Sweetened drinks, such as soda or iced tea. The items listed above may not be a complete list of foods and beverages you should avoid. Contact a dietitian for more information. Questions to ask a health care provider  Do I need to meet with a diabetes educator?  Do I need to meet with a dietitian?  What number can I call if I have questions?  When are the best times to check my blood glucose? Where to find more information:  American Diabetes Association: diabetes.org  Academy of Nutrition and Dietetics: www.eatright.org  National Institute of Diabetes and Digestive and Kidney Diseases: www.niddk.nih.gov  Association of Diabetes Care and Education Specialists: www.diabeteseducator.org Summary  It is important to have healthy eating   habits because your blood sugar (glucose) levels are greatly affected by what you eat and drink.  A healthy meal plan will help you control your blood glucose and maintain a healthy lifestyle.  Your health care provider may recommend that you work with a dietitian to make a meal plan that is best for you.  Keep in mind that carbohydrates (carbs) and alcohol have immediate effects on your blood glucose levels. It is important to count carbs and to use alcohol carefully. This information is not intended to replace advice given to you by your health care provider. Make sure you discuss any questions you have with your health care provider. Document Revised: 05/24/2019 Document Reviewed: 05/24/2019 Elsevier Patient Education  2021 Elsevier Inc.  

## 2020-12-07 NOTE — Chronic Care Management (AMB) (Signed)
  Care Management   Note  12/07/2020 Name: Joseph Burch MRN: [id number] DOB: [birth year]  Joseph Burch is a 50 y.o. year old male who is a primary care patient of Cannady, Dorie Rank, NP. I reached out to Stephanie Acre by phone today in response to a referral sent by Joseph Burch [facility] health plan.    Joseph Burch was given information about care management services today including:  Care management services include personalized support from designated clinical staff supervised by his physician, including individualized plan of care and coordination with other care providers 24/7 contact phone numbers for assistance for urgent and routine care needs. The patient may stop care management services at any time by phone call to the office staff.  Patient agreed to services and verbal consent obtained.   Follow up plan: Telephone appointment with care management team member scheduled for: 12/28/2020  Penne Lash, RMA Care Guide, Embedded Care Coordination [facility]  [place], [place] [address] Direct Dial: [phone] [email or web address]

## 2020-12-07 NOTE — Assessment & Plan Note (Signed)
Chronic, ongoing.   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   - At this time will continue Metformin XR 1000 MG BID as sugars appear to be coming own to goal and below goal  - Educated him on alternatives if alternate medications needed, if A1c remains >7% next check, such as SGLT 2 and GLP 1 medications.  He is interested in SGLT 2, prefers not to use injectable unless needed.  Will consider this is ongoing elevations (possible Marcelline Deist - may be covered on review).   - CCM referral to assist with costs and medication adherence  - Recommend he monitor BS 2-3 times daily with goal fasting <130 and goal 2 hours post meal <180.  Document and bring to visits.   Return in August, will obtain labs -- A1c in office and urine ALB.

## 2020-12-28 ENCOUNTER — Telehealth: Payer: Self-pay | Admitting: Pharmacist

## 2020-12-28 ENCOUNTER — Ambulatory Visit: Payer: 59 | Admitting: Pharmacist

## 2020-12-28 DIAGNOSIS — E1129 Type 2 diabetes mellitus with other diabetic kidney complication: Secondary | ICD-10-CM

## 2020-12-28 DIAGNOSIS — I10 Essential (primary) hypertension: Secondary | ICD-10-CM

## 2020-12-28 MED ORDER — STEGLATRO 5 MG PO TABS: 5.0000 mg | ORAL_TABLET | Freq: Every day | ORAL | 11 refills | Status: DC

## 2020-12-28 NOTE — Progress Notes (Signed)
Chronic Care Management Pharmacy Note  01/03/2021 Name:  Joseph Burch MRN:  [id number] DOB:  April 27, 1971  Recommendations/Changes made from today's visit: Steglatro with copay card for DM. Change to 10 mg amlodipine tablet which should be cheaper for patient.  Plan: Will send copay card to patient and ask PCP to send new RX.   Subjective: Joseph Burch is an 50 y.o. year old male who is a primary patient of Cannady, Barbaraann Faster, NP.  The CCM team was consulted for assistance with disease management and care coordination needs.    Engaged with patient by telephone for initial visit in response to provider referral for pharmacy case management and/or care coordination services.   Consent to Services:  The patient was given the following information about Chronic Care Management services today, agreed to services, and gave verbal consent: 1. CCM service includes personalized support from designated clinical staff supervised by the primary care provider, including individualized plan of care and coordination with other care providers 2. 24/7 contact phone numbers for assistance for urgent and routine care needs. 3. Service will only be billed when office clinical staff spend 20 minutes or more in a month to coordinate care. 4. Only one practitioner may furnish and bill the service in a calendar month. 5.The patient may stop CCM services at any time (effective at the end of the month) by phone call to the office staff. 6. The patient will be responsible for cost sharing (co-pay) of up to 20% of the service fee (after annual deductible is met). Patient agreed to services and consent obtained.  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%, return in august, consider Farxiga 11/07/20- Cannady (initial visit)-out of meds for 3  months Recent consult visits: 12/26/19-Stoioff(urology)-labs, renal US, refer to nephrology, asymptomatic microscopic hematuria,  Hospital visits: None in previous 6 months   Objective:  Lab Results  Component Value Date   CREATININE 1.13 11/21/2019   BUN 23 11/21/2019   GFR 68.44 11/21/2019   GFRNONAA >60 09/24/2019   GFRAA >60 09/24/2019   NA 138 11/21/2019   K 3.9 11/21/2019   CALCIUM 9.5 11/21/2019   CO2 29 11/21/2019   GLUCOSE 137 (H) 11/21/2019    Lab Results  Component Value Date/Time   HGBA1C 7.7 (H) 11/21/2019 12:56 PM   HGBA1C 7.5 (H) 03/25/2019 05:45 PM   GFR 68.44 11/21/2019 12:56 PM   MICROALBUR 3.3 (H) 11/21/2019 12:56 PM    Last diabetic Eye exam: No results found for: HMDIABEYEEXA  Last diabetic Foot exam: No results found for: HMDIABFOOTEX   Lab Results  Component Value Date   CHOL 170 11/21/2019   HDL 39.40 11/21/2019   LDLCALC 106 (H) 11/21/2019   TRIG 125.0 11/21/2019   CHOLHDL 4 11/21/2019    Hepatic Function Latest Ref Rng & Units 11/21/2019 09/24/2019 03/25/2019  Total Protein 6.0 - 8.3 g/dL 6.9 7.7 7.2  Albumin 3.5 - 5.2 g/dL 4.4 4.4 4.2  AST 0 - 37 U/L 23 24 20   ALT 0 - 53 U/L 25 26 22   Alk Phosphatase 39 - 117 U/L 89 126 88  Total Bilirubin 0.2 - 1.2 mg/dL 0.7 0.7 0.7    Lab Results  Component Value Date/Time   TSH 1.73 11/21/2019 12:56 PM    CBC Latest Ref Rng & Units 11/21/2019 09/24/2019 03/25/2019  WBC 4.0 - 10.5 K/uL 5.7 8.5 6.4  Hemoglobin 13.0 - 17.0 g/dL 15.9 16.7 16.3  Hematocrit 39.0 - 52.0 % 46.6 49.5 47.6  Platelets 150.0 - 400.0 K/uL 246.0 245 255    No results found for: VD25OH  Clinical ASCVD: No  The 10-year ASCVD risk score Mikey Bussing DC Jr., et al., 2013) is: 8.8%   Values used to calculate the score:     Age: 60 years     Sex: Male     Is Non-Hispanic African American: No     Diabetic: Yes     Tobacco smoker: No     Systolic Blood Pressure: 222 mmHg     Is BP treated: Yes     HDL Cholesterol: 39.4 mg/dL      Total Cholesterol: 170 mg/dL    Depression screen [facility] 2/9 12/07/2020 01/23/2020  Decreased Interest 0 0  Down, Depressed, Hopeless 0 0  PHQ - 2 Score 0 0  Altered sleeping - 0  Tired, decreased energy - 0  Change in appetite - 0  Feeling bad or failure about yourself  - 0  Trouble concentrating - 0  Moving slowly or fidgety/restless - 0  Suicidal thoughts - 0  PHQ-9 Score - 0  Difficult doing work/chores - Not difficult at all       Social History   Tobacco Use  Smoking Status Former   Pack years: 0.00   Types: E-cigarettes, Cigarettes   Quit date: 11/05/2011   Years since quitting: 9.1  Smokeless Tobacco Never   BP Readings from Last 3 Encounters:  12/07/20 122/84  11/07/20 (!) 146/88  01/23/20 (!) 138/82   Pulse Readings from Last 3 Encounters:  12/07/20 73  11/07/20 62  01/23/20 66   Wt Readings from Last 3 Encounters:  12/07/20 173 lb 3.2 oz (78.6 kg)  11/07/20 178 lb 9.6 oz (81 kg)  02/13/20 165 lb (74.8 kg)   BMI Readings from Last 3 Encounters:  12/07/20 28.37 kg/m  11/07/20 29.27 kg/m  02/13/20 26.63 kg/m    Assessment/Interventions: Review of patient past medical history, allergies, medications, health status, including review of consultants reports, laboratory and other test data, was performed as part of comprehensive evaluation and provision of chronic care management services.   SDOH:  (Social Determinants of Health) assessments and interventions performed: Yes SDOH Interventions    Flowsheet Row Most Recent Value  SDOH Interventions   Financial Strain Interventions Other (Comment)  [patient assistance]      SDOH Screenings   Alcohol Screen: Low Risk    Last Alcohol Screening Score (AUDIT): 0  Depression (PHQ2-9): Low Risk    PHQ-2 Score: 0  Financial Resource Strain: Low Risk    Difficulty of Paying Living Expenses: Not very hard  Food Insecurity: No Food Insecurity   Worried About Charity fundraiser in the Last Year: Never true    Ran Out of Food in the Last Year: Never true  Housing: Low Risk    Last Housing Risk Score: 0  Physical Activity: Sufficiently Active   Days of Exercise per Week: 4 days   Minutes of Exercise per Session: 50 min  Social Connections: Moderately Integrated   Frequency of Communication with Friends and Family: More than three times a week   Frequency of Social Gatherings with Friends and Family: More than three times a week   Attends Religious Services: 1 to 4 times per year   Active Member of Clubs or Organizations: No  Attends Archivist Meetings: Never   Marital Status: Married  Stress: No Stress Concern Present   Feeling of Stress : Only a little  Tobacco Use: Medium Risk   Smoking Tobacco Use: Former   Smokeless Tobacco Use: Never  Transportation Needs: No Data processing manager (Medical): No   Lack of Transportation (Non-Medical): No     Patient Active Problem List   Diagnosis Date Noted   Allergic rhinitis 11/07/2020   Right shoulder pain 11/07/2020   Left hand pain 11/07/2020   BMI 29.0-29.9,adult 11/07/2020   Hyperlipidemia associated with type 2 diabetes mellitus ([place]) 11/03/2020   Screening for prostate cancer 02/13/2020   Hypertension associated with diabetes ([place]) 11/22/2019   Type 2 diabetes mellitus with proteinuria ([place]) 11/22/2019   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  Conditions to be addressed/monitored:  Hypertension, Hyperlipidemia, Diabetes, and Chronic Kidney Disease  Care Plan : Pharmacy Care Plan  Updates made by Vladimir Faster, [place] since 01/03/2021 12:00 AM     Problem: DM2, CKD, HTN,   Priority: High  Note:   urrent Barriers:  Unable to independently afford treatment regimen Unable to independently monitor therapeutic efficacy Suboptimal therapeutic regimen for diabetes, CKD  Pharmacist Clinical  Goal(s):  Patient will verbalize ability to afford treatment regimen adhere to plan to optimize therapeutic regimen for diabetes, CKD as evidenced by report of adherence to recommended medication management changes through collaboration with PharmD and provider.   Interventions: 1:1 collaboration with Venita Lick, NP regarding development and update of comprehensive plan of care as evidenced by provider attestation and co-signature Inter-disciplinary care team collaboration (see longitudinal plan of care) Comprehensive medication review performed; medication list updated in electronic medical record  Hypertension (BP goal <130/80) -Controlled -Current treatment: Amlodipine 10  mg qd -Medications previously tried: ARB?  -Current home readings: not checking -Current dietary habits: Drives a truck. Hard to eat healthy or adhere to exercise regimen. -Current exercise habits: walks some with his wife -Denies hypotensive/hypertensive symptoms -Educated on BP goals and benefits of medications for prevention of heart attack, stroke and kidney damage; Daily salt intake goal < 2300 mg; Importance of home blood pressure monitoring; Symptoms of hypotension and importance of maintaining adequate hydration; -Counseled to monitor BP at home 3 times weekly, document, and provide log at future appointments -Recommended to change to amlodipine 10 mg tablet as this should be cheaper for patient. Assessed patient finances. Change to amlodipine 10 mg tablet for less expensive copay.  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 Had been out of meds for ~ 3 months. Lost to follow-up when PCP left  practice -Denies hypoglycemic/hyperglycemic symptoms --Educated on A1c and blood sugar goals; Complications of diabetes including kidney damage, retinal damage, and cardiovascular disease; Prevention and management of hypoglycemic episodes; Benefits of routine self-monitoring of blood sugar; -Counseled to check feet daily and get yearly eye exams -Counseled on diet and exercise extensively Recommended to continue current medication Recommended to start SGLT-2 for cardiorenal protection Collaborated with PCP to change medication and send new  RX Assessed patient finances. Insurance copay for [employer]  prohibitive > $300/month with discount card. Steglatro discount card covers entire monthly cost of medication. Completed onlien registration and had discount card emailed to patient.  Patient Goals/Self-Care Activities Patient will:  - check glucose daily, document, and provide at future appointments check blood pressure weekly, document, and provide at future appointments collaborate with provider on medication access solutions  Follow Up Plan: The [place] team will follow up with the patient and will provide direct communication to the PCP for this patient.          Medication Assistance: None required.  Patient affirms current coverage meets needs.    Star-Rating Drugs: Metformin ER 1000 mg bid  Patient's preferred pharmacy is:  [facility] [address] ([address]), [place]) [place] [address] Phone: [phone] Fax: [phone]  Uses pill box? No - too few meds Pt endorses 90 % compliance  We discussed: Benefits of medication synchronization, packaging and delivery as well as enhanced pharmacist oversight with [employer]. Patient decided to: Continue current medication management strategy  Care Plan and Follow Up Patient Decision:  Patient agrees to Care Plan and Follow-up.  Plan:  The patient  has been provided with contact information for the care management team and has been advised to call with any health related questions or concerns.   Junita Push. Kenton Kingfisher PharmD, [facility] [phone]   C

## 2020-12-28 NOTE — Telephone Encounter (Signed)
Steglatro sent in

## 2021-01-03 ENCOUNTER — Other Ambulatory Visit: Payer: Self-pay | Admitting: Nurse Practitioner

## 2021-01-03 ENCOUNTER — Telehealth: Payer: Self-pay | Admitting: Pharmacist

## 2021-01-03 MED ORDER — AMLODIPINE BESYLATE 10 MG PO TABS: 10.0000 mg | ORAL_TABLET | Freq: Every day | ORAL | 4 refills | Status: DC

## 2021-01-03 NOTE — Patient Instructions (Signed)
Visit Information  It was a pleasure speaking with you today! Thank you for letting me be a part of your care team. Please call with any questions or concerns.   Goals Addressed             This Visit's Progress    Manage My Diet       Timeframe:  Long-Range Goal Priority:  Medium Start Date:                             Expected End Date:                       Follow Up Date   - ask for help if I have trouble affording healthy foods - choose foods that are low in sodium (salt) - eat 3 to 5 servings of fruits and vegetables each day - prepare or eat main meal at home 3 to 5 days each week - read food labels for sodium (salt), fat and sugar content    Why is this important?   A healthy diet is important for mental and physical health.  Healthy food helps repair damaged body tissue and maintains strong bones and muscles.  No single food is just right so eating a variety of proteins, fruits, vegetables and grains is best.  You may need to change what you eat or drink to manage kidney disease.  A dietitian is the best person to guide you.     Notes:       Monitor and Manage My Blood Sugar-Diabetes Type 2       Timeframe:  Short-Term Goal Priority:  High Start Date:                             Expected End Date:                       Follow Up Date   - check blood sugar at prescribed times - check blood sugar if I feel it is too high or too low - enter blood sugar readings and medication or insulin into daily log - take the blood sugar meter to all doctor visits    Why is this important?   Checking your blood sugar at home helps to keep it from getting very high or very low.  Writing the results in a diary or log helps the doctor know how to care for you.  Your blood sugar log should have the time, date and the results.  Also, write down the amount of insulin or other medicine that you take.  Other information, like what you ate, exercise done and how you were feeling,  will also be helpful.     Notes:          Mr. Athey was given information about Chronic Care Management services today including:  CCM service includes personalized support from designated clinical staff supervised by his physician, including individualized plan of care and coordination with other care providers 24/7 contact phone numbers for assistance for urgent and routine care needs. Standard insurance, coinsurance, copays and deductibles apply for chronic care management only during months in which we provide at least 20 minutes of these services. Most insurances cover these services at 100%, however patients may be responsible for any copay, coinsurance and/or deductible if applicable. This service may help you  avoid the need for more expensive face-to-face services. Only one practitioner may furnish and bill the service in a calendar month. The patient may stop CCM services at any time (effective at the end of the month) by phone call to the office staff.  Patient agreed to services and verbal consent obtained.   Patient verbalizes understanding of instructions provided today and agrees to view in MyChart.  Will ask central pharmacy team to follow up with patient.  Mercer Pod. Tiburcio Pea PharmD, BCPS Clinical Pharmacist [phone]

## 2021-01-03 NOTE — Telephone Encounter (Signed)
Jolene,  Would you send a new prescription to [facility] for amlodipine 10 mg tablets? This should be cheaper than 2 of the 5 mg. Thank you:)

## 2021-01-04 ENCOUNTER — Telehealth: Payer: 59

## 2021-01-07 ENCOUNTER — Telehealth: Payer: Self-pay

## 2021-01-07 NOTE — Chronic Care Management (AMB) (Signed)
  Care Management   Note  01/07/2021 Name: Joseph Burch MRN: [id number] DOB: 24-Sep-1970  Joseph Burch is a 51 y.o. year old male who is a primary care patient of Cannady, Dorie Rank, NP. I reached out to Stephanie Acre by phone today in response to a referral sent by Joseph Burch [facility] health plan.    [name] was given information about care management services today including:  Care management services include personalized support from designated clinical staff supervised by his physician, including individualized plan of care and coordination with other care providers 24/7 contact phone numbers for assistance for urgent and routine care needs. The patient may stop care management services at any time by phone call to the office staff.  Patient agreed to services and verbal consent obtained.   Follow up plan: Telephone appointment with care management team member scheduled for:03/01/2021  Penne Lash, RMA Care Guide, Embedded Care Coordination [facility]  [place], [place] [address] Direct Dial: [phone] [email or web address] Website: [email or web address]

## 2021-01-07 NOTE — Progress Notes (Signed)
Aram Beecham   I scheduled this patient with you for DM.  Thank you  Penne Lash, RMA Care Guide, Embedded Care Coordination [facility]  [place], [place] [address] Direct Dial: [phone] [email or web address]: [email or web address]

## 2021-01-16 ENCOUNTER — Ambulatory Visit: Payer: Self-pay | Admitting: General Practice

## 2021-01-16 DIAGNOSIS — E1169 Type 2 diabetes mellitus with other specified complication: Secondary | ICD-10-CM

## 2021-01-16 DIAGNOSIS — I152 Hypertension secondary to endocrine disorders: Secondary | ICD-10-CM

## 2021-01-16 DIAGNOSIS — E1159 Type 2 diabetes mellitus with other circulatory complications: Secondary | ICD-10-CM

## 2021-01-16 DIAGNOSIS — I10 Essential (primary) hypertension: Secondary | ICD-10-CM

## 2021-01-16 DIAGNOSIS — R809 Proteinuria, unspecified: Secondary | ICD-10-CM

## 2021-01-16 DIAGNOSIS — E1129 Type 2 diabetes mellitus with other diabetic kidney complication: Secondary | ICD-10-CM

## 2021-01-16 NOTE — Chronic Care Management (AMB) (Signed)
Care Management    RN Visit Note  01/16/2021 Name: Joseph Burch MRN: [id number] DOB: 07-06-1970  Subjective: Joseph Burch is a 50 y.o. year old male who is a primary care patient of Cannady, Barbaraann Faster, NP. The care management team was consulted for assistance with disease management and care coordination needs.    Engaged with patient by telephone for initial visit in response to provider referral for case management and/or care coordination services.   Consent to Services:   Joseph Burch was given information about Care Management services today including:  Care Management services includes personalized support from designated clinical staff supervised by his physician, including individualized plan of care and coordination with other care providers 24/7 contact phone numbers for assistance for urgent and routine care needs. The patient may stop case management services at any time by phone call to the office staff.  Patient agreed to services and consent obtained.   Assessment: Review of patient past medical history, allergies, medications, health status, including review of consultants reports, laboratory and other test data, was performed as part of comprehensive evaluation and provision of chronic care management services.   SDOH (Social Determinants of Health) assessments and interventions performed:    Care Plan  Allergies  Allergen Reactions   Guaifenesin Itching   Codeine Rash   Other Rash    Outpatient Encounter Medications as of 01/16/2021  Medication Sig   amLODipine (NORVASC) 10 MG tablet Take 1 tablet (10 mg total) by mouth daily.   aspirin EC 81 MG tablet Take 81 mg by mouth daily. Swallow whole.   [employer] Lancets Thin 26G MISC To check blood sugar 2-3 times daily.   ertugliflozin L-PyroglutamicAc (STEGLATRO) 5 MG TABS tablet Take 1 tablet (5 mg total) by mouth daily.   glucose blood ([employer] GLUCOSE METER TEST STRIPS) test strip To check blood sugar 2-3 times daily.    metFORMIN (GLUCOPHAGE XR) 500 MG 24 hr tablet Take 2 tablets (1,000 mg total) by mouth 2 (two) times daily.   No facility-administered encounter medications on file as of 01/16/2021.    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   Asymptomatic microscopic hematuria 11/22/2019    Conditions to be addressed/monitored: HTN, HLD, and DMII  Care Plan : RNCM: Management of Chronic Conditions: HTN, HLD, DM2  Updates made by Vanita Ingles since 01/16/2021 12:00 AM     Problem: RNCM: Chronic disese management care plan for HTN, HLD, DM2   Priority: High     Long-Range Goal: RNCM: Management of HTN, HLD, DM2   Start Date: 01/16/2021  Expected End Date: 01/16/2022  This Visit's Progress: On track  Priority: High  Note:   Current Barriers:  Knowledge Deficits related to plan of care for management of HTN, HLD, and DMII  Care Coordination needs related to Level of care concerns in a patient with HTN, HLD, and DMII Chronic Disease Management support and education needs related to HTN, HLD, and DMII Non-adherence to prescribed medication regimen  RNCM Clinical Goal(s):  Patient will verbalize understanding of plan for management of HTN, HLD, and DMII work  with RN Case Manager to address needs related to HTN, HLD, and DMII and Level of care concerns take all medications exactly as prescribed and will call provider for medication related questions attend all scheduled medical appointments: 02-08-2021 at 10 am demonstrate a decrease in HTN, HLD, and DMII exacerbations demonstrate improved  adherence to prescribed treatment plan for HTN, HLD, and DMII demonstrate improved health management independence verbalize basic understanding of HTN, HLD, and DMII disease process and self health management plan demonstrate understanding of rationale for each prescribed medication demonstrate ongoing self health care management ability through collaboration with RN Care manager, provider, and care team.   Interventions: 1:1 collaboration with Marnee Guarneri, NP, regarding development and update of comprehensive plan of care as evidenced by provider attestation and co-signature Inter-disciplinary care team collaboration (see longitudinal plan of care)   Diabetes:  (Status: New goal. 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 Patient had been off of medications x 3 months but is consistently taking; Counseled on importance of regular laboratory monitoring as prescribed, discussed hemoglobin A1C goal and likelihood of testing at next appointment with pcp. The patient is hopeful that his A1C is going to be less than 9.3.  Discussed plans with patient for ongoing care management follow up and provided patient with direct contact information for care management team; Provided patient with written educational materials related to hypo and hyperglycemia and importance of correct treatment- will send information through [facility] and My chart; Reviewed scheduled/upcoming provider appointments including: 02-08-2021 at 10 am; Advised patient, providing education and rationale, to check cbg bid and record, calling pcp for findings outside established parameters, the patient is checking his  blood sugars on the weekends. 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  Encouraged the patient to check as much as possible when he is out on the road. Patient reports fasting is usually around 115 and post prandial numbers are 165-180. Review of goals of fasting <130 and post prandial of <180, also discussed 15 minutes of moderate exercise could help the patient with lowering blood sugars. The patient advised it is hard to have an exercise routine on the road; Review of patient status, including review of consultants reports, relevant laboratory and other test results, and medications completed; Screening for signs and symptoms of depression related to chronic disease state;  Assessed social determinant of health barriers;   Hyperlipidemia:  (Status: New goal. Goal on track: YES.) Medication review performed; medication list updated in electronic medical record.  Provider established cholesterol goals reviewed; Counseled on importance of regular laboratory monitoring as prescribed; Provided HLD educational materials; Reviewed role and benefits of statin for ASCVD risk reduction; Discussed strategies to manage statin-induced myalgias; Reviewed importance of limiting foods high in cholesterol; Screening for signs and symptoms of depression related to chronic disease state;  Assessed social determinant of health barriers;   Hypertension: (Status: New goal. Goal on track: YES.) Last practice recorded BP readings:  BP Readings from Last 3 Encounters:  12/07/20 122/84  11/07/20 (!) 146/88  01/23/20 (!) 138/82  Most recent eGFR/CrCl: No results found for: EGFR  No components found for: CRCL  Evaluation of current  treatment plan related to hypertension self management and patient's adherence to plan as established by provider; Provided education to patient re: stroke prevention, s/s of heart attack and stroke; Reviewed medications with patient  and discussed importance of compliance; Discussed plans with patient for ongoing care management follow up and provided patient with direct contact information for care management team; Advised patient, providing education and rationale, to monitor blood pressure daily and record, calling PCP for findings outside established parameters;  Reviewed scheduled/upcoming provider appointments including:  Provided education on prescribed diet heart healthy/ADA;  Discussed complications of poorly controlled blood pressure such as heart disease, stroke, circulatory complications, vision complications, kidney impairment, sexual dysfunction;   Patient Goals/Self-Care Activities: Patient will self administer medications as prescribed Patient will attend all scheduled provider appointments Patient will call pharmacy for medication refills Patient will attend church or other social activities Patient will continue to perform ADL's independently Patient will continue to perform IADL's independently Patient will call provider office for new concerns or questions Patient will work with BSW to address care coordination needs and will continue to work with the clinical team to address health care and disease management related needs.    Follow Up Plan: Telephone follow up appointment with care management team member scheduled for: 03-01-2021 at 945 am     Plan: Telephone follow up appointment with care management team member scheduled for:  03-01-2021 at [place] am  Noreene Larsson RN, MSN, [facility] Mobile: [phone]

## 2021-01-16 NOTE — Patient Instructions (Signed)
Visit Information  PATIENT GOALS:   Goals Addressed             This Visit's Progress    RNCM: Manage My Diet       Timeframe:  Long-Range Goal Priority:  High Start Date:    01-16-2021                         Expected End Date:     01-16-2022                  Follow Up Date : 03-01-2021 - ask for help if I have trouble affording healthy foods - choose foods that are low in sodium (salt) - eat 3 to 5 servings of fruits and vegetables each day - prepare or eat main meal at home 3 to 5 days each week - read food labels for sodium (salt), fat and sugar content    Why is this important?   A healthy diet is important for mental and physical health.  Healthy food helps repair damaged body tissue and maintains strong bones and muscles.  No single food is just right so eating a variety of proteins, fruits, vegetables and grains is best.  You may need to change what you eat or drink to manage kidney disease.  A dietitian is the best person to guide you.     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  He is staying away from potatoes, breads, and pastas. Will send some educational information on heart heatlhy/ADA diet for the patient. Will continue to monitor for changes.      RNCM: Manage My Medicine       Timeframe:  Short-Term Goal Priority:  High Start Date:       01-16-2021                      Expected End Date:     06-18-2021                  Follow Up Date 03-01-2021   - call for medicine refill 2 or 3 days before it runs out - call if I am sick and can't take my medicine - keep a list of all the medicines I take; vitamins and herbals too - learn to read medicine labels - use a pillbox to sort medicine - use an alarm clock or phone to remind me to take my medicine    Why is this important?   These steps will help you keep on track with your medicines.   Notes: 01-16-2021: The patient now has his medications and is taking as  prescribed. Contact information given for RNCM for questions, concerns or needs. Will continue to monitor for changes.      RNCM: Monitor and Manage My Blood Sugar-Diabetes Type 2       Timeframe:  Short-Term Goal Priority:  High Start Date:     01-16-2021                        Expected End Date:      06-18-2021                 Follow Up Date: 03-01-2021   - check blood sugar at prescribed times - check blood sugar if I feel it is too high or too low - enter blood  sugar readings and medication or insulin into daily log - take the blood sugar meter to all doctor visits    Why is this important?   Checking your blood sugar at home helps to keep it from getting very high or very low.  Writing the results in a diary or log helps the doctor know how to care for you.  Your blood sugar log should have the time, date and the results.  Also, write down the amount of insulin or other medicine that you take.  Other information, like what you ate, exercise done and how you were feeling, will also be helpful.     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 The patient states that he does on the weekend and fasting it is usually around 115 and post prandial 165 to 180. Confirms compliance with medications at this time.         Joseph Burch was given information about Care Management services by the embedded care coordination team including:  Care Management services include personalized support from designated clinical staff supervised by his physician, including individualized plan of care and coordination with other care providers 24/7 contact phone numbers for assistance for urgent and routine care needs. The patient may stop CCM services at any time (effective at the end of the month) by phone call to the office staff.  Patient agreed to services and verbal consent obtained.   Patient verbalizes understanding of  instructions provided today and agrees to view in MyChart.   Telephone follow up appointment with care management team member scheduled for: 03-01-2021 at 0945 am  Alto Denver RN, MSN, CCM Community Care Coordinator [place]  [facility] Mobile: [phone]   [email or web address]/docs/default-source/living-[email or web address]-[email or web address]-the-[email or web address]-[email or web address]?[email or web address]">  Carbohydrate Counting for Diabetes Mellitus, Adult Carbohydrate counting is a method of keeping track of how many carbohydrates you eat. Eating carbohydrates naturally increases the amount of sugar (glucose) in the blood. Counting how many carbohydrates you eat improves your bloodglucose control, which helps you manage your diabetes. It is important to know how many carbohydrates you can safely have in each meal. This is different for every person. A dietitian can help you make a meal plan and calculate how many carbohydrates you should have at each meal andsnack. What foods contain carbohydrates? Carbohydrates are found in the following foods: Grains, such as breads and cereals. Dried beans and soy products. Starchy vegetables, such as potatoes, peas, and corn. Fruit and fruit juices. Milk and yogurt. Sweets and snack foods, such as cake, cookies, candy, chips, and soft drinks. How do I count carbohydrates in foods? There are two ways to count carbohydrates in food. You can read food labels or learn standard serving sizes of foods. You can use either of the methods or acombination of both. Using the Nutrition Facts label The Nutrition Facts list is included on the labels of almost all packaged foods and beverages in the [place] It includes: The serving size. Information about nutrients in each serving, including the grams (g) of carbohydrate per serving. To use the Nutrition Facts: Decide how many servings you will have. Multiply the number of servings by the number of  carbohydrates per serving. The resulting number is the total amount of carbohydrates that you will be having. Learning the standard serving sizes of foods When you eat carbohydrate foods that are not packaged or do not include Nutrition Facts  on the label, you need to measure the servings in order to count the amount of carbohydrates. Measure the foods that you will eat with a food scale or measuring cup, if needed. Decide how many standard-size servings you will eat. Multiply the number of servings by 15. For foods that contain carbohydrates, one serving equals 15 g of carbohydrates. For example, if you eat 2 cups or 10 oz (300 g) of strawberries, you will have eaten 2 servings and 30 g of carbohydrates (2 servings x 15 g = 30 g). For foods that have more than one food mixed, such as soups and casseroles, you must count the carbohydrates in each food that is included. The following list contains standard serving sizes of common carbohydrate-rich foods. Each of these servings has about 15 g of carbohydrates: 1 slice of bread. 1 six-inch (15 cm) tortilla. ? cup or 2 oz (53 g) cooked rice or pasta.  cup or 3 oz (85 g) cooked or canned, drained and rinsed beans or lentils.  cup or 3 oz (85 g) starchy vegetable, such as peas, corn, or squash.  cup or 4 oz (120 g) hot cereal.  cup or 3 oz (85 g) boiled or mashed potatoes, or  or 3 oz (85 g) of a large baked potato.  cup or 4 fl oz (118 mL) fruit juice. 1 cup or 8 fl oz (237 mL) milk. 1 small or 4 oz (106 g) apple.  or 2 oz (63 g) of a medium banana. 1 cup or 5 oz (150 g) strawberries. 3 cups or 1 oz (24 g) popped popcorn. What is an example of carbohydrate counting? To calculate the number of carbohydrates in this sample meal, follow the stepsshown below. Sample meal 3 oz (85 g) chicken breast. ? cup or 4 oz (106 g) brown rice.  cup or 3 oz (85 g) corn. 1 cup or 8 fl oz (237 mL) milk. 1 cup or 5 oz (150 g) strawberries with  sugar-free whipped topping. Carbohydrate calculation Identify the foods that contain carbohydrates: Rice. Corn. Milk. Strawberries. Calculate how many servings you have of each food: 2 servings rice. 1 serving corn. 1 serving milk. 1 serving strawberries. Multiply each number of servings by 15 g: 2 servings rice x 15 g = 30 g. 1 serving corn x 15 g = 15 g. 1 serving milk x 15 g = 15 g. 1 serving strawberries x 15 g = 15 g. Add together all of the amounts to find the total grams of carbohydrates eaten: 30 g + 15 g + 15 g + 15 g = 75 g of carbohydrates total. What are tips for following this plan? Shopping Develop a meal plan and then make a shopping list. Buy fresh and frozen vegetables, fresh and frozen fruit, dairy, eggs, beans, lentils, and whole grains. Look at food labels. Choose foods that have more fiber and less sugar. Avoid processed foods and foods with added sugars. Meal planning Aim to have the same amount of carbohydrates at each meal and for each snack time. Plan to have regular, balanced meals and snacks. Where to find more information American Diabetes Association: www.diabetes.org Centers for Disease Control and Prevention: FootballExhibition.com.br Summary Carbohydrate counting is a method of keeping track of how many carbohydrates you eat. Eating carbohydrates naturally increases the amount of sugar (glucose) in the blood. Counting how many carbohydrates you eat improves your blood glucose control, which helps you manage your diabetes. A dietitian can help you make a  meal plan and calculate how many carbohydrates you should have at each meal and snack. This information is not intended to replace advice given to you by your health care provider. Make sure you discuss any questions you have with your healthcare provider. Document Revised: 06/16/2019 Document Reviewed: 06/17/2019 Elsevier Patient Education  2021 [employer]. The Journal of Orthopaedic and Sports Physical  Therapy, 44(10), 748. FishingAward.fi.2014.0506">  How to Increase Your Level of Physical Activity Getting regular physical activity is important for your overall health and well-being. Most people do not get enough exercise. There are easy ways to increase your level of physical activity, even if you have not been very activein the past or if you are just starting out. How can increasing my physical activity affect me? Physical activity has many short-term and long-term benefits. Being active on a regular basis can improve your physical and mental health as well as provideother benefits. Physical health benefits Helping you lose weight or maintain a healthy weight. Strengthening your muscles and bones. Reducing your risk of certain long-term (chronic) diseases, including heart disease, cancer, and diabetes. Being able to move around more easily and for longer periods of time without getting tired (increased stamina). Improving your ability to fight off illness (enhanced immunity). Being able to sleep better. Helping you stay healthy as you get older, including: Helping you stay mobile, or capable of walking and moving around. Preventing accidents, such as falls. Increasing life expectancy. Mental health benefits Boosting your mood and improving your self-esteem. Lowering your chance of having mental health problems, such as depression or anxiety. Helping you feel good about your body. Other benefits Finding new sources of fun and enjoyment. Meeting new people who share a common interest. What steps can I take to be more physically active? Getting started If you have a chronic illness or have not been active for a while, check with your health care provider about how to get started. Ask your health care provider what activities are safe for you. Start out slowly. Walking or doing some simple chair exercises is a good place to start, especially if you have not been active  before or for a long time. Set goals that you can work toward. Ask your health care provider how much exercise is best for you. In general, most adults should: Do moderate-intensity exercise for at least 150 minutes each week (30 minutes on most days of the week) or vigorous exercise for at least 75 minutes each week, or a combination of these. Moderate-intensity exercise can include walking at a quick pace, biking, yoga, water aerobics, or gardening. Vigorous exercise involves activities that take more effort, such as jogging or running, playing sports, swimming laps, or jumping rope. Do strength exercises on at least 2 days each week. This can include weight lifting, body weight exercises, and resistance-band exercises. Consider using a fitness tracker, such as a mobile phone app or a device worn like a watch, that will count the number of steps you take each day. Many people strive to reach 10,000 steps a day. Choosing activities Try to find activities that you enjoy. You are more likely to commit to an exercise routine if it does not feel like a chore. If you have bone or joint problems, choose low-impact exercises, like walking or swimming. Use these tips for being successful with an exercise plan: Find a workout partner for accountability. Join a group or class, such as an aerobics class, cycling class, or sports team. Make family time  active. Go for a walk, bike, or swim. Include a variety of exercises each week. Being active in your daily routines Besides your formal exercise plans, you can find ways to do physical activity during your daily routines, such as: Walking or biking to work or to the store. Taking the stairs instead of the elevator. Parking farther away from the door at work or at the store. Planning walking meetings. Walking around while you are on the phone.  Where to find more information Centers for Disease Control and Prevention:  CampusCasting.com.pt President's Council on Fitness, Sports & Nutrition: www.fitness.gov ChooseMyPlate: https://ball-collins.biz/ Contact a health care provider if: You have headaches, muscle aches, or joint pain. You feel dizzy or light-headed while exercising. You faint. You have chest pain while exercising. Summary Exercise benefits your mind and body at any age, even if you are just starting out. If you have a chronic illness or have not been active for a while, check with your health care provider before increasing your physical activity. Choose activities that are safe and enjoyable for you. Ask your health care provider what activities are safe for you. Start slowly. Tell your health care provider if you have problems as you start to increase your activity level. This information is not intended to replace advice given to you by your health care provider. Make sure you discuss any questions you have with your healthcare provider. Document Revised: 05/30/2020 Document Reviewed: 01/10/2019 Elsevier Patient Education  2022 Elsevier Inc. Diabetes Mellitus and Nutrition, Adult When you have diabetes, or diabetes mellitus, it is very important to have healthy eating habits because your blood sugar (glucose) levels are greatly affected by what you eat and drink. Eating healthy foods in the right amounts, at about the same times every day, can help you: Control your blood glucose. Lower your risk of heart disease. Improve your blood pressure. Reach or maintain a healthy weight. What can affect my meal plan? Every person with diabetes is different, and each person has different needs for a meal plan. Your health care provider may recommend that you work with a dietitian to make a meal plan that is best for you. Your meal plan may vary depending on factors such as: The calories you need. The medicines you take. Your weight. Your blood glucose, blood pressure, and cholesterol  levels. Your activity level. Other health conditions you have, such as heart or kidney disease. How do carbohydrates affect me? Carbohydrates, also called carbs, affect your blood glucose level more than any other type of food. Eating carbs naturally raises the amount of glucose in your blood. Carb counting is a method for keeping track of how many carbs you eat. Counting carbs is important to keep your blood glucose at a healthy level,especially if you use insulin or take certain oral diabetes medicines. It is important to know how many carbs you can safely have in each meal. This is different for every person. Your dietitian can help you calculate how manycarbs you should have at each meal and for each snack. How does alcohol affect me? Alcohol can cause a sudden decrease in blood glucose (hypoglycemia), especially if you use insulin or take certain oral diabetes medicines. Hypoglycemia can be a life-threatening condition. Symptoms of hypoglycemia, such as sleepiness, dizziness, and confusion, are similar to symptoms of having too much alcohol. Do not drink alcohol if: Your health care provider tells you not to drink. You are pregnant, may be pregnant, or are planning to become pregnant. If you  drink alcohol: Do not drink on an empty stomach. Limit how much you use to: 0-1 drink a day for women. 0-2 drinks a day for men. Be aware of how much alcohol is in your drink. In the [place] one drink equals one 12 oz bottle of beer (355 mL), one 5 oz glass of wine (148 mL), or one 1 oz glass of hard liquor (44 mL). Keep yourself hydrated with water, diet soda, or unsweetened iced tea. Keep in mind that regular soda, juice, and other mixers may contain a lot of sugar and must be counted as carbs. What are tips for following this plan?  Reading food labels Start by checking the serving size on the "Nutrition Facts" label of packaged foods and drinks. The amount of calories, carbs, fats, and other  nutrients listed on the label is based on one serving of the item. Many items contain more than one serving per package. Check the total grams (g) of carbs in one serving. You can calculate the number of servings of carbs in one serving by dividing the total carbs by 15. For example, if a food has 30 g of total carbs per serving, it would be equal to 2 servings of carbs. Check the number of grams (g) of saturated fats and trans fats in one serving. Choose foods that have a low amount or none of these fats. Check the number of milligrams (mg) of salt (sodium) in one serving. Most people should limit total sodium intake to less than 2,300 mg per day. Always check the nutrition information of foods labeled as "low-fat" or "nonfat." These foods may be higher in added sugar or refined carbs and should be avoided. Talk to your dietitian to identify your daily goals for nutrients listed on the label. Shopping Avoid buying canned, pre-made, or processed foods. These foods tend to be high in fat, sodium, and added sugar. Shop around the outside edge of the grocery store. This is where you will most often find fresh fruits and vegetables, bulk grains, fresh meats, and fresh dairy. Cooking Use low-heat cooking methods, such as baking, instead of high-heat cooking methods like deep frying. Cook using healthy oils, such as olive, canola, or sunflower oil. Avoid cooking with butter, cream, or high-fat meats. Meal planning Eat meals and snacks regularly, preferably at the same times every day. Avoid going long periods of time without eating. Eat foods that are high in fiber, such as fresh fruits, vegetables, beans, and whole grains. Talk with your dietitian about how many servings of carbs you can eat at each meal. Eat 4-6 oz (112-168 g) of lean protein each day, such as lean meat, chicken, fish, eggs, or tofu. One ounce (oz) of lean protein is equal to: 1 oz (28 g) of meat, chicken, or fish. 1 egg.  cup (62 g)  of tofu. Eat some foods each day that contain healthy fats, such as avocado, nuts, seeds, and fish. What foods should I eat? Fruits Berries. Apples. Oranges. Peaches. Apricots. Plums. Grapes. Mango. Papaya.Pomegranate. Kiwi. Cherries. Vegetables Lettuce. Spinach. Leafy greens, including kale, chard, collard greens, and mustard greens. Beets. Cauliflower. Cabbage. Broccoli. Carrots. Green beans.Tomatoes. Peppers. Onions. Cucumbers. Brussels sprouts. Grains Whole grains, such as whole-wheat or whole-grain bread, crackers, tortillas,cereal, and pasta. Unsweetened oatmeal. Quinoa. Brown or wild rice. Meats and other proteins Seafood. Poultry without skin. Lean cuts of poultry and beef. Tofu. Nuts. Seeds. Dairy Low-fat or fat-free dairy products such as milk, yogurt, and cheese. The items listed above  may not be a complete list of foods and beverages you can eat. Contact a dietitian for more information. What foods should I avoid? Fruits Fruits canned with syrup. Vegetables Canned vegetables. Frozen vegetables with butter or cream sauce. Grains Refined white flour and flour products such as bread, pasta, snack foods, andcereals. Avoid all processed foods. Meats and other proteins Fatty cuts of meat. Poultry with skin. Breaded or fried meats. Processed meat.Avoid saturated fats. Dairy Full-fat yogurt, cheese, or milk. Beverages Sweetened drinks, such as soda or iced tea. The items listed above may not be a complete list of foods and beverages you should avoid. Contact a dietitian for more information. Questions to ask a health care provider Do I need to meet with a diabetes educator? Do I need to meet with a dietitian? What number can I call if I have questions? When are the best times to check my blood glucose? Where to find more information: American Diabetes Association: diabetes.org Academy of Nutrition and Dietetics: www.eatright.[employer] of Diabetes and Digestive and  Kidney Diseases: CarFlippers.tn Association of Diabetes Care and Education Specialists: www.diabeteseducator.org Summary It is important to have healthy eating habits because your blood sugar (glucose) levels are greatly affected by what you eat and drink. A healthy meal plan will help you control your blood glucose and maintain a healthy lifestyle. Your health care provider may recommend that you work with a dietitian to make a meal plan that is best for you. Keep in mind that carbohydrates (carbs) and alcohol have immediate effects on your blood glucose levels. It is important to count carbs and to use alcohol carefully. This information is not intended to replace advice given to you by your health care provider. Make sure you discuss any questions you have with your healthcare provider. Document Revised: 05/24/2019 Document Reviewed: 05/24/2019 Elsevier Patient Education  2021 Elsevier Inc. https://www.mata.com/.pdf">  DASH Eating Plan DASH stands for Dietary Approaches to Stop Hypertension. The DASH eating plan is a healthy eating plan that has been shown to: Reduce high blood pressure (hypertension). Reduce your risk for type 2 diabetes, heart disease, and stroke. Help with weight loss. What are tips for following this plan? Reading food labels Check food labels for the amount of salt (sodium) per serving. Choose foods with less than 5 percent of the Daily Value of sodium. Generally, foods with less than 300 milligrams (mg) of sodium per serving fit into this eating plan. To find whole grains, look for the word "whole" as the first word in the ingredient list. Shopping Buy products labeled as "low-sodium" or "no salt added." Buy fresh foods. Avoid canned foods and pre-made or frozen meals. Cooking Avoid adding salt when cooking. Use salt-free seasonings or herbs instead of table salt or sea salt. Check with your health care provider or  pharmacist before using salt substitutes. Do not fry foods. Cook foods using healthy methods such as baking, boiling, grilling, roasting, and broiling instead. Cook with heart-healthy oils, such as olive, canola, avocado, soybean, or sunflower oil. Meal planning  Eat a balanced diet that includes: 4 or more servings of fruits and 4 or more servings of vegetables each day. Try to fill one-half of your plate with fruits and vegetables. 6-8 servings of whole grains each day. Less than 6 oz (170 g) of lean meat, poultry, or fish each day. A 3-oz (85-g) serving of meat is about the same size as a deck of cards. One egg equals 1 oz (28 g). 2-3 servings  of low-fat dairy each day. One serving is 1 cup (237 mL). 1 serving of nuts, seeds, or beans 5 times each week. 2-3 servings of heart-healthy fats. Healthy fats called omega-3 fatty acids are found in foods such as walnuts, flaxseeds, fortified milks, and eggs. These fats are also found in cold-water fish, such as sardines, salmon, and mackerel. Limit how much you eat of: Canned or prepackaged foods. Food that is high in trans fat, such as some fried foods. Food that is high in saturated fat, such as fatty meat. Desserts and other sweets, sugary drinks, and other foods with added sugar. Full-fat dairy products. Do not salt foods before eating. Do not eat more than 4 egg yolks a week. Try to eat at least 2 vegetarian meals a week. Eat more home-cooked food and less restaurant, buffet, and fast food.  Lifestyle When eating at a restaurant, ask that your food be prepared with less salt or no salt, if possible. If you drink alcohol: Limit how much you use to: 0-1 drink a day for women who are not pregnant. 0-2 drinks a day for men. Be aware of how much alcohol is in your drink. In the [place] one drink equals one 12 oz bottle of beer (355 mL), one 5 oz glass of wine (148 mL), or one 1 oz glass of hard liquor (44 mL). General information Avoid  eating more than 2,300 mg of salt a day. If you have hypertension, you may need to reduce your sodium intake to 1,500 mg a day. Work with your health care provider to maintain a healthy body weight or to lose weight. Ask what an ideal weight is for you. Get at least 30 minutes of exercise that causes your heart to beat faster (aerobic exercise) most days of the week. Activities may include walking, swimming, or biking. Work with your health care provider or dietitian to adjust your eating plan to your individual calorie needs. What foods should I eat? Fruits All fresh, dried, or frozen fruit. Canned fruit in natural juice (without addedsugar). Vegetables Fresh or frozen vegetables (raw, steamed, roasted, or grilled). Low-sodium or reduced-sodium tomato and vegetable juice. Low-sodium or reduced-sodium tomatosauce and tomato paste. Low-sodium or reduced-sodium canned vegetables. Grains Whole-grain or whole-wheat bread. Whole-grain or whole-wheat pasta. Brown rice. Orpah Cobb. Bulgur. Whole-grain and low-sodium cereals. Pita bread.Low-fat, low-sodium crackers. Whole-wheat flour tortillas. Meats and other proteins Skinless chicken or [place]. Ground chicken or [place]. Pork with fat trimmed off. Fish and seafood. Egg whites. Dried beans, peas, or lentils. Unsalted nuts, nut butters, and seeds. Unsalted canned beans. Lean cuts of beef with fat trimmed off. Low-sodium, lean precooked or cured meat, such as sausages or meatloaves. Dairy Low-fat (1%) or fat-free (skim) milk. Reduced-fat, low-fat, or fat-free cheeses. Nonfat, low-sodium ricotta or cottage cheese. Low-fat or nonfatyogurt. Low-fat, low-sodium cheese. Fats and oils Soft margarine without trans fats. Vegetable oil. Reduced-fat, low-fat, or light mayonnaise and salad dressings (reduced-sodium). Canola, safflower, olive, avocado, soybean, andsunflower oils. Avocado. Seasonings and condiments Herbs. Spices. Seasoning mixes without salt. Other  foods Unsalted popcorn and pretzels. Fat-free sweets. The items listed above may not be a complete list of foods and beverages you can eat. Contact a dietitian for more information. What foods should I avoid? Fruits Canned fruit in a light or heavy syrup. Fried fruit. Fruit in cream or buttersauce. Vegetables Creamed or fried vegetables. Vegetables in a cheese sauce. Regular canned vegetables (not low-sodium or reduced-sodium). Regular canned tomato sauce and paste (not low-sodium  or reduced-sodium). Regular tomato and vegetable juice(not low-sodium or reduced-sodium). Rosita FirePickles. Olives. Grains Baked goods made with fat, such as croissants, muffins, or some breads. Drypasta or rice meal packs. Meats and other proteins Fatty cuts of meat. Ribs. Fried meat. Tomasa BlaseBacon. Bologna, salami, and other precooked or cured meats, such as sausages or meat loaves. Fat from the back of a pig (fatback). Bratwurst. Salted nuts and seeds. Canned beans with added salt. Canned orsmoked fish. Whole eggs or egg yolks. Chicken or [place] with skin. Dairy Whole or 2% milk, cream, and half-and-half. Whole or full-fat cream cheese. Whole-fat or sweetened yogurt. Full-fat cheese. Nondairy creamers. Whippedtoppings. Processed cheese and cheese spreads. Fats and oils Butter. Stick margarine. Lard. Shortening. Ghee. Bacon fat. Tropical oils, suchas coconut, palm kernel, or palm oil. Seasonings and condiments Onion salt, garlic salt, seasoned salt, table salt, and sea salt. Worcestershire sauce. Tartar sauce. Barbecue sauce. Teriyaki sauce. Soy sauce, including reduced-sodium. Steak sauce. Canned and packaged gravies. Fish sauce. Oyster sauce. Cocktail sauce. Store-bought horseradish. Ketchup. Mustard. Meat flavorings and tenderizers. Bouillon cubes. Hot sauces. Pre-made or packaged marinades. Pre-made or packaged taco seasonings. Relishes. Regular saladdressings. Other foods Salted popcorn and pretzels. The items listed above may  not be a complete list of foods and beverages you should avoid. Contact a dietitian for more information. Where to find more information National Heart, Lung, and Blood Institute: PopSteam.iswww.nhlbi.nih.gov American Heart Association: www.heart.org Academy of Nutrition and Dietetics: www.eatright.org National Kidney Foundation: www.kidney.org Summary The DASH eating plan is a healthy eating plan that has been shown to reduce high blood pressure (hypertension). It may also reduce your risk for type 2 diabetes, heart disease, and stroke. When on the DASH eating plan, aim to eat more fresh fruits and vegetables, whole grains, lean proteins, low-fat dairy, and heart-healthy fats. With the DASH eating plan, you should limit salt (sodium) intake to 2,300 mg a day. If you have hypertension, you may need to reduce your sodium intake to 1,500 mg a day. Work with your health care provider or dietitian to adjust your eating plan to your individual calorie needs. This information is not intended to replace advice given to you by your health care provider. Make sure you discuss any questions you have with your healthcare provider. Document Revised: 05/20/2019 Document Reviewed: 05/20/2019 Elsevier Patient Education  2022 [employer]

## 2021-02-08 ENCOUNTER — Ambulatory Visit (INDEPENDENT_AMBULATORY_CARE_PROVIDER_SITE_OTHER): Payer: 59 | Admitting: Nurse Practitioner

## 2021-02-08 ENCOUNTER — Other Ambulatory Visit: Payer: Self-pay

## 2021-02-08 ENCOUNTER — Encounter: Payer: Self-pay | Admitting: Nurse Practitioner

## 2021-02-08 VITALS — BP 128/74 | HR 78 | Temp 97.9°F | Wt 171.4 lb

## 2021-02-08 DIAGNOSIS — E1129 Type 2 diabetes mellitus with other diabetic kidney complication: Secondary | ICD-10-CM

## 2021-02-08 DIAGNOSIS — E1169 Type 2 diabetes mellitus with other specified complication: Secondary | ICD-10-CM | POA: Diagnosis not present

## 2021-02-08 DIAGNOSIS — E785 Hyperlipidemia, unspecified: Secondary | ICD-10-CM

## 2021-02-08 DIAGNOSIS — H9313 Tinnitus, bilateral: Secondary | ICD-10-CM | POA: Diagnosis not present

## 2021-02-08 DIAGNOSIS — E1159 Type 2 diabetes mellitus with other circulatory complications: Secondary | ICD-10-CM

## 2021-02-08 DIAGNOSIS — Z6829 Body mass index (BMI) 29.0-29.9, adult: Secondary | ICD-10-CM

## 2021-02-08 DIAGNOSIS — I152 Hypertension secondary to endocrine disorders: Secondary | ICD-10-CM

## 2021-02-08 DIAGNOSIS — R809 Proteinuria, unspecified: Secondary | ICD-10-CM

## 2021-02-08 DIAGNOSIS — Z125 Encounter for screening for malignant neoplasm of prostate: Secondary | ICD-10-CM

## 2021-02-08 LAB — MICROALBUMIN, URINE WAIVED
Creatinine, Urine Waived: 50 mg/dL (ref 10–300)
Microalb, Ur Waived: 30 mg/L — ABNORMAL HIGH (ref 0–19)
Microalb/Creat Ratio: <30 mg/g (ref <30)

## 2021-02-08 LAB — BAYER DCA HB A1C WAIVED: HB A1C (BAYER DCA - WAIVED): 6.6 % (ref <7.0)

## 2021-02-08 NOTE — Assessment & Plan Note (Signed)
PSA obtained today.

## 2021-02-08 NOTE — Assessment & Plan Note (Signed)
Chronic, ongoing with BP at goal on recheck today.  Did not tolerate ARB in past, although would benefit from ACE or ARB with his diabetes and urine ALB 30 today, consider low dose ACE trial in future.  Recommend he monitor BP at least a few mornings a week at home and document.  DASH diet at home.  Continue current medication regimen and adjust as needed.  Labs: TSH, CBC, CMP.  Return in 6 months.

## 2021-02-08 NOTE — Patient Instructions (Signed)
Diabetes Mellitus and Nutrition, Adult When you have diabetes, or diabetes mellitus, it is very important to have healthy eating habits because your blood sugar (glucose) levels are greatly affected by what you eat and drink. Eating healthy foods in the right amounts, at about the same times every day, can help you:  Control your blood glucose.  Lower your risk of heart disease.  Improve your blood pressure.  Reach or maintain a healthy weight. What can affect my meal plan? Every person with diabetes is different, and each person has different needs for a meal plan. Your health care provider may recommend that you work with a dietitian to make a meal plan that is best for you. Your meal plan may vary depending on factors such as:  The calories you need.  The medicines you take.  Your weight.  Your blood glucose, blood pressure, and cholesterol levels.  Your activity level.  Other health conditions you have, such as heart or kidney disease. How do carbohydrates affect me? Carbohydrates, also called carbs, affect your blood glucose level more than any other type of food. Eating carbs naturally raises the amount of glucose in your blood. Carb counting is a method for keeping track of how many carbs you eat. Counting carbs is important to keep your blood glucose at a healthy level, especially if you use insulin or take certain oral diabetes medicines. It is important to know how many carbs you can safely have in each meal. This is different for every person. Your dietitian can help you calculate how many carbs you should have at each meal and for each snack. How does alcohol affect me? Alcohol can cause a sudden decrease in blood glucose (hypoglycemia), especially if you use insulin or take certain oral diabetes medicines. Hypoglycemia can be a life-threatening condition. Symptoms of hypoglycemia, such as sleepiness, dizziness, and confusion, are similar to symptoms of having too much  alcohol.  Do not drink alcohol if: ? Your health care provider tells you not to drink. ? You are pregnant, may be pregnant, or are planning to become pregnant.  If you drink alcohol: ? Do not drink on an empty stomach. ? Limit how much you use to:  0-1 drink a day for women.  0-2 drinks a day for men. ? Be aware of how much alcohol is in your drink. In the [place] one drink equals one 12 oz bottle of beer (355 mL), one 5 oz glass of wine (148 mL), or one 1 oz glass of hard liquor (44 mL). ? Keep yourself hydrated with water, diet soda, or unsweetened iced tea.  Keep in mind that regular soda, juice, and other mixers may contain a lot of sugar and must be counted as carbs. What are tips for following this plan? Reading food labels  Start by checking the serving size on the "Nutrition Facts" label of packaged foods and drinks. The amount of calories, carbs, fats, and other nutrients listed on the label is based on one serving of the item. Many items contain more than one serving per package.  Check the total grams (g) of carbs in one serving. You can calculate the number of servings of carbs in one serving by dividing the total carbs by 15. For example, if a food has 30 g of total carbs per serving, it would be equal to 2 servings of carbs.  Check the number of grams (g) of saturated fats and trans fats in one serving. Choose foods that have   a low amount or none of these fats.  Check the number of milligrams (mg) of salt (sodium) in one serving. Most people should limit total sodium intake to less than 2,300 mg per day.  Always check the nutrition information of foods labeled as "low-fat" or "nonfat." These foods may be higher in added sugar or refined carbs and should be avoided.  Talk to your dietitian to identify your daily goals for nutrients listed on the label. Shopping  Avoid buying canned, pre-made, or processed foods. These foods tend to be high in fat, sodium, and added  sugar.  Shop around the outside edge of the grocery store. This is where you will most often find fresh fruits and vegetables, bulk grains, fresh meats, and fresh dairy. Cooking  Use low-heat cooking methods, such as baking, instead of high-heat cooking methods like deep frying.  Cook using healthy oils, such as olive, canola, or sunflower oil.  Avoid cooking with butter, cream, or high-fat meats. Meal planning  Eat meals and snacks regularly, preferably at the same times every day. Avoid going long periods of time without eating.  Eat foods that are high in fiber, such as fresh fruits, vegetables, beans, and whole grains. Talk with your dietitian about how many servings of carbs you can eat at each meal.  Eat 4-6 oz (112-168 g) of lean protein each day, such as lean meat, chicken, fish, eggs, or tofu. One ounce (oz) of lean protein is equal to: ? 1 oz (28 g) of meat, chicken, or fish. ? 1 egg. ?  cup (62 g) of tofu.  Eat some foods each day that contain healthy fats, such as avocado, nuts, seeds, and fish.   What foods should I eat? Fruits Berries. Apples. Oranges. Peaches. Apricots. Plums. Grapes. Mango. Papaya. Pomegranate. Kiwi. Cherries. Vegetables Lettuce. Spinach. Leafy greens, including kale, chard, collard greens, and mustard greens. Beets. Cauliflower. Cabbage. Broccoli. Carrots. Green beans. Tomatoes. Peppers. Onions. Cucumbers. Brussels sprouts. Grains Whole grains, such as whole-wheat or whole-grain bread, crackers, tortillas, cereal, and pasta. Unsweetened oatmeal. Quinoa. Brown or wild rice. Meats and other proteins Seafood. Poultry without skin. Lean cuts of poultry and beef. Tofu. Nuts. Seeds. Dairy Low-fat or fat-free dairy products such as milk, yogurt, and cheese. The items listed above may not be a complete list of foods and beverages you can eat. Contact a dietitian for more information. What foods should I avoid? Fruits Fruits canned with  syrup. Vegetables Canned vegetables. Frozen vegetables with butter or cream sauce. Grains Refined white flour and flour products such as bread, pasta, snack foods, and cereals. Avoid all processed foods. Meats and other proteins Fatty cuts of meat. Poultry with skin. Breaded or fried meats. Processed meat. Avoid saturated fats. Dairy Full-fat yogurt, cheese, or milk. Beverages Sweetened drinks, such as soda or iced tea. The items listed above may not be a complete list of foods and beverages you should avoid. Contact a dietitian for more information. Questions to ask a health care provider  Do I need to meet with a diabetes educator?  Do I need to meet with a dietitian?  What number can I call if I have questions?  When are the best times to check my blood glucose? Where to find more information:  American Diabetes Association: diabetes.org  Academy of Nutrition and Dietetics: www.eatright.org  National Institute of Diabetes and Digestive and Kidney Diseases: www.niddk.nih.gov  Association of Diabetes Care and Education Specialists: www.diabeteseducator.org Summary  It is important to have healthy eating   habits because your blood sugar (glucose) levels are greatly affected by what you eat and drink.  A healthy meal plan will help you control your blood glucose and maintain a healthy lifestyle.  Your health care provider may recommend that you work with a dietitian to make a meal plan that is best for you.  Keep in mind that carbohydrates (carbs) and alcohol have immediate effects on your blood glucose levels. It is important to count carbs and to use alcohol carefully. This information is not intended to replace advice given to you by your health care provider. Make sure you discuss any questions you have with your health care provider. Document Revised: 05/24/2019 Document Reviewed: 05/24/2019 Elsevier Patient Education  2021 Elsevier Inc.  
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
## 2021-02-08 NOTE — Assessment & Plan Note (Signed)
Chronic, ongoing.   A1c today 6.6%, much improved from previous 9.3%. Praised for success. - At this time will continue Metformin XR 1000 MG BID and Steglatro 5 MG as offering benefit - CCM assistance in place. - Recommend he monitor BS 2-3 times daily with goal fasting <130 and goal 2 hours post meal <180.  Document and bring to visits.   Return in 3 months for follow-up.

## 2021-02-08 NOTE — Assessment & Plan Note (Signed)
Noted elevation in LDL at 106 on last labs, discussed use of statin in diabetes, wishes to think about this.  Plan on lipid panel today and continue to educate/recommend statin use.  Return in 3 months.

## 2021-02-08 NOTE — Assessment & Plan Note (Signed)
Recommended eating smaller high protein, low fat meals more frequently and exercising 30 mins a day 5 times a week with a goal of 10-15lb weight loss in the next 3 months. Patient voiced their understanding and motivation to adhere to these recommendations.  
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
## 2021-02-09 LAB — COMPREHENSIVE METABOLIC PANEL
ALT: 28 IU/L (ref 0–44)
AST: 20 IU/L (ref 0–40)
Albumin/Globulin Ratio: 1.9 (ref 1.2–2.2)
Albumin: 4.6 g/dL (ref 3.8–4.9)
Alkaline Phosphatase: 107 IU/L (ref 44–121)
BUN/Creatinine Ratio: 13 (ref 9–20)
BUN: 14 mg/dL (ref 6–24)
Bilirubin Total: 0.5 mg/dL (ref 0.0–1.2)
CO2: 21 mmol/L (ref 20–29)
Calcium: 9.9 mg/dL (ref 8.7–10.2)
Chloride: 100 mmol/L (ref 96–106)
Creatinine, Ser: 1.07 mg/dL (ref 0.76–1.27)
Globulin, Total: 2.4 g/dL (ref 1.5–4.5)
Glucose: 172 mg/dL — ABNORMAL HIGH (ref 65–99)
Potassium: 4.3 mmol/L (ref 3.5–5.2)
Sodium: 140 mmol/L (ref 134–144)
Total Protein: 7 g/dL (ref 6.0–8.5)
eGFR: 83 mL/min/{1.73_m2} (ref >59)

## 2021-02-09 LAB — LIPID PANEL W/O CHOL/HDL RATIO
Cholesterol, Total: 166 mg/dL (ref 100–199)
HDL: 39 mg/dL — ABNORMAL LOW (ref >39)
LDL Chol Calc (NIH): 102 mg/dL — ABNORMAL HIGH (ref 0–99)
Triglycerides: 139 mg/dL (ref 0–149)
VLDL Cholesterol Cal: 25 mg/dL (ref 5–40)

## 2021-02-09 LAB — CBC WITH DIFFERENTIAL/PLATELET
Basophils Absolute: 0 10*3/uL (ref 0.0–0.2)
Basos: 1 %
EOS (ABSOLUTE): 0.2 10*3/uL (ref 0.0–0.4)
Eos: 4 %
Hematocrit: 49.1 % (ref 37.5–51.0)
Hemoglobin: 16.8 g/dL (ref 13.0–17.7)
Immature Grans (Abs): 0 10*3/uL (ref 0.0–0.1)
Immature Granulocytes: 1 %
Lymphocytes Absolute: 1.5 10*3/uL (ref 0.7–3.1)
Lymphs: 36 %
MCH: 29.9 pg (ref 26.6–33.0)
MCHC: 34.2 g/dL (ref 31.5–35.7)
MCV: 87 fL (ref 79–97)
Monocytes Absolute: 0.5 10*3/uL (ref 0.1–0.9)
Monocytes: 13 %
Neutrophils Absolute: 1.9 10*3/uL (ref 1.4–7.0)
Neutrophils: 45 %
Platelets: 270 10*3/uL (ref 150–450)
RBC: 5.62 x10E6/uL (ref 4.14–5.80)
RDW: 12.4 % (ref 11.6–15.4)
WBC: 4.2 10*3/uL (ref 3.4–10.8)

## 2021-02-09 LAB — TSH: TSH: 1.08 u[IU]/mL (ref 0.450–4.500)

## 2021-02-09 LAB — PSA: Prostate Specific Ag, Serum: 0.7 ng/mL (ref 0.0–4.0)

## 2021-02-09 NOTE — Progress Notes (Signed)
Contacted via [facility] morning Joseph Burch, your labs have returned: - Kidney function, creatinine and eGFR, is normal. - Liver function, AST and ALT, is normal. - CBC shows no anemia - Thyroid and prostate labs normal - Cholesterol labs do show elevation in LDL, bad cholesterol, would like to see this less then 70.  I do highly recommend starting a low dose, Atorvastatin 10 MG, to help lower this and prevent stroke and heart attack with your diabetes.  This is very important prevention.  Would you like to start this?  Let me know.  Any questions? Keep being awesome!!  Thank you for allowing me to participate in your care.  I appreciate you. Kindest regards, Delicia Berens

## 2021-03-01 ENCOUNTER — Ambulatory Visit: Payer: Self-pay

## 2021-03-01 ENCOUNTER — Other Ambulatory Visit: Payer: Self-pay | Admitting: Nurse Practitioner

## 2021-03-01 ENCOUNTER — Telehealth: Payer: 59

## 2021-03-01 ENCOUNTER — Telehealth: Payer: 59 | Admitting: General Practice

## 2021-03-01 DIAGNOSIS — R809 Proteinuria, unspecified: Secondary | ICD-10-CM

## 2021-03-01 DIAGNOSIS — E1169 Type 2 diabetes mellitus with other specified complication: Secondary | ICD-10-CM

## 2021-03-01 DIAGNOSIS — E1159 Type 2 diabetes mellitus with other circulatory complications: Secondary | ICD-10-CM

## 2021-03-01 DIAGNOSIS — E785 Hyperlipidemia, unspecified: Secondary | ICD-10-CM

## 2021-03-01 DIAGNOSIS — E1129 Type 2 diabetes mellitus with other diabetic kidney complication: Secondary | ICD-10-CM

## 2021-03-01 MED ORDER — ATORVASTATIN CALCIUM 10 MG PO TABS
10.0000 mg | ORAL_TABLET | Freq: Every day | ORAL | 3 refills | Status: DC
Start: 2021-03-01 — End: 2021-05-17

## 2021-03-01 NOTE — Chronic Care Management (AMB) (Signed)
Care Management    RN Visit Note  03/01/2021 Name: Joseph Burch MRN: [id number] DOB: Mar 08, 1971  Subjective: Joseph Burch is a 50 y.o. year old male who is a primary care patient of Cannady, Barbaraann Faster, NP. The care management team was consulted for assistance with disease management and care coordination needs.    Engaged with patient by telephone for follow up visit in response to provider referral for case management and/or care coordination services.   Consent to Services:   Joseph Burch was given information about Care Management services today including:  Care Management services includes personalized support from designated clinical staff supervised by his physician, including individualized plan of care and coordination with other care providers 24/7 contact phone numbers for assistance for urgent and routine care needs. The patient may stop case management services at any time by phone call to the office staff.  Patient agreed to services and consent obtained.   Assessment: Review of patient past medical history, allergies, medications, health status, including review of consultants reports, laboratory and other test data, was performed as part of comprehensive evaluation and provision of chronic care management services.   SDOH (Social Determinants of Health) assessments and interventions performed:    Care Plan  Allergies  Allergen Reactions   Guaifenesin Itching   Codeine Rash   Other Rash    Outpatient Encounter Medications as of 03/01/2021  Medication Sig   amLODipine (NORVASC) 10 MG tablet Take 1 tablet (10 mg total) by mouth daily.   aspirin EC 81 MG tablet Take 81 mg by mouth daily. Swallow whole.   [employer] Lancets Thin 26G MISC To check blood sugar 2-3 times daily.   ertugliflozin L-PyroglutamicAc (STEGLATRO) 5 MG TABS tablet Take 1 tablet (5 mg total) by mouth daily.   glucose blood ([employer] GLUCOSE METER TEST STRIPS) test strip To check blood sugar 2-3 times daily.    metFORMIN (GLUCOPHAGE XR) 500 MG 24 hr tablet Take 2 tablets (1,000 mg total) by mouth 2 (two) times daily.   No facility-administered encounter medications on file as of 03/01/2021.    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/22/2019   Asymptomatic microscopic hematuria 11/22/2019    Conditions to be addressed/monitored: HTN, HLD, and DMII  Care Plan : RNCM: Management of Chronic Conditions: HTN, HLD, DM2  Updates made by Vanita Ingles, RN since 03/01/2021 12:00 AM     Problem: RNCM: Chronic disese management care plan for HTN, HLD, DM2   Priority: High     Long-Range Goal: RNCM: Management of HTN, HLD, DM2   Start Date: 01/16/2021  Expected End Date: 01/16/2022  This Visit's Progress: On track  Recent Progress: On track  Priority: High  Note:   Current Barriers:  Knowledge Deficits related to plan of care for management of HTN, HLD, and DMII  Care Coordination needs related to Level of care concerns in a patient with HTN, HLD, and DMII Chronic Disease Management support and education needs related to HTN, HLD, and DMII Non-adherence to prescribed medication regimen  RNCM Clinical Goal(s):  Patient will verbalize understanding of plan for management of HTN, HLD, and  DMII work with RN Case Manager to address needs related to HTN, HLD, and DMII and Level of care concerns take all medications exactly as prescribed and will call provider for medication related questions attend all scheduled medical appointments: 05-17-2021 demonstrate a decrease in HTN, HLD, and DMII exacerbations demonstrate improved adherence to  prescribed treatment plan for HTN, HLD, and DMII demonstrate improved health management independence verbalize basic understanding of HTN, HLD, and DMII disease process and self health management plan demonstrate understanding of rationale for each prescribed medication demonstrate ongoing self health care management ability through collaboration with RN Care manager, provider, and care team.   Interventions: 1:1 collaboration with Marnee Guarneri, NP, regarding development and update of comprehensive plan of care as evidenced by provider attestation and co-signature Inter-disciplinary care team collaboration (see longitudinal plan of care)   Diabetes:  (Status: Goal on track: YES.) Lab Results  Component Value Date   HGBA1C 6.6 02/08/2021  *A1C on DOT physical on 11-02-2020 was 9.3- on 02-08-2021 new value 6.6% Assessed patient's understanding of A1c goal: <7% Provided education to patient about basic DM disease process; Reviewed medications with patient and discussed importance of medication adherence, patient states compliance with medications. Patient had been off of medications x 3 months but is consistently taking; Counseled on importance of regular laboratory monitoring as prescribed, discussed hemoglobin A1C goal and likelihood of testing at next appointment with pcp. The patient is hopeful that his A1C is going to be less than 9.3. 03-01-2021: Newest A1C level is 6.6 on 02-08-2021 Discussed plans with patient for ongoing care management follow up and provided patient with direct contact information for care management team; Provided patient with written educational materials related to hypo and hyperglycemia and importance of correct treatment- will send information through [facility] and My chart; Reviewed scheduled/upcoming provider appointments including: 05-17-2021 Advised patient, providing education and rationale, to check cbg bid and record, calling pcp for findings outside established  parameters, the patient is checking his blood sugars on the weekends. 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  Encouraged the patient to check as much as possible when he is out on the road. Patient reports fasting is usually around 115 and post prandial numbers are 165-180. Review of goals of fasting <130 and post prandial of <180, also discussed 15 minutes of moderate exercise could help the patient with lowering blood sugars. The patient advised it is hard to have an exercise routine on the road. 03-01-2021: The patient checks his blood sugars when he is home. He states fasting it is 115 in the mornings and usually always <180 after eating. States he eats a lot of chicken. Does like to eat bacon. Discussed healthier options and only eating bacon once in a while.  Review of patient status, including review of consultants reports, relevant laboratory and other test results, and medications completed; Screening for signs and symptoms of depression related to chronic disease state;  Assessed social determinant of health barriers;   Hyperlipidemia:  (Status: Goal on track: NO.) Medication review performed; medication list updated in electronic medical record.  Provider established cholesterol goals reviewed, 03-01-2021: Reviewed the labwork and notes from the pcp on 02-08-2021. The patients LDL is 106 and goal is <70.  The patient does not want to take another pill but states if his pcp feels like this is something he needs to do he will do it. Recommended dietary changes as well to help  with lowering of LDL.  Education and support given. The patient agrees to start the atorvastatin 10 mg and ask that it be sent in to [facility] and he will pick up with his other refills. In basket message sent to pcp with request after review of the pcp notes with the patient. Advised the patient to call with any questions or concerns.   Counseled on importance of  regular laboratory monitoring as prescribed; Provided HLD educational materials; Reviewed role and benefits of statin for ASCVD risk reduction; Discussed strategies to manage statin-induced myalgias; Reviewed importance of limiting foods high in cholesterol. 03-01-2021: Reviewed and gave suggestions for foods that support heart health and lower cholesterol;  Screening for signs and symptoms of depression related to chronic disease state;  Assessed social determinant of health barriers;   Hypertension: (Status: Goal on track: YES.) Last practice recorded BP readings:  BP Readings from Last 3 Encounters:  02/08/21 128/74  12/07/20 122/84  11/07/20 (!) 146/88  Most recent eGFR/CrCl: No results found for: EGFR  No components found for: CRCL  Evaluation of current treatment plan related to hypertension self management and patient's adherence to plan as established by provider. 03-01-2021: The patient has more stable blood pressures at this time. Denies any acute needs. States that he is taking his medications as prescribed. No new concerns with HTN management;   Provided education to patient re: stroke prevention, s/s of heart attack and stroke; Reviewed medications with patient and discussed importance of compliance;  Discussed plans with patient for ongoing care management follow up and provided patient with direct contact information for care management team; Advised patient, providing education and rationale, to monitor blood pressure daily and record, calling PCP for findings outside established parameters;  Reviewed scheduled/upcoming provider appointments including, 05-17-2021 for follow up with the pcp   Provided education on prescribed diet heart healthy/ADA. 03-01-2021: Review with the patient- recommendations provided;  Discussed complications of poorly controlled blood pressure such as heart disease, stroke, circulatory complications, vision complications, kidney impairment, sexual  dysfunction;   Patient Goals/Self-Care Activities: Patient will self administer medications as prescribed Patient will attend all scheduled provider appointments Patient will call pharmacy for medication refills Patient will attend church or other social activities Patient will continue to perform ADL's independently Patient will continue to perform IADL's independently Patient will call provider office for new concerns or questions Patient will work with BSW to address care coordination needs and will continue to work with the clinical team to address health care and disease management related needs.    Follow Up Plan: Telephone follow up appointment with care management team member scheduled for: 05-21-2021 at 900 am     Plan: Telephone follow up appointment with care management team member scheduled for:  05-21-2021 at 0900 am  Noreene Larsson RN, MSN, [facility] Mobile: [phone]

## 2021-03-01 NOTE — Patient Instructions (Signed)
Visit Information   Goals Addressed             This Visit's Progress    RNCM: Manage My Diet       Timeframe:  Long-Range Goal Priority:  High Start Date:    01-16-2021                         Expected End Date:     01-16-2022                  Follow Up Date : 05-21-2021 - ask for help if I have trouble affording healthy foods - choose foods that are low in sodium (salt) - eat 3 to 5 servings of fruits and vegetables each day - prepare or eat main meal at home 3 to 5 days each week - read food labels for sodium (salt), fat and sugar content    Why is this important?   A healthy diet is important for mental and physical health.  Healthy food helps repair damaged body tissue and maintains strong bones and muscles.  No single food is just right so eating a variety of proteins, fruits, vegetables and grains is best.  You may need to change what you eat or drink to manage kidney disease.  A dietitian is the best person to guide you.     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  He is staying away from potatoes, breads, and pastas. Will send some educational information on heart heatlhy/ADA diet for the patient. Will continue to monitor for changes. 03-01-2021: The patient is being more mindful of his eating habits while being out on the road. Cannot always eat healthy but has decided to stop drinking Dr. Reino Kent. He says this has helped a lot. Discussed other drink option with zero sugars.      RNCM: Manage My Medicine       Timeframe:  Short-Term Goal Priority:  High Start Date:       01-16-2021                      Expected End Date:     06-18-2021                  Follow Up Date 05-21-2021   - call for medicine refill 2 or 3 days before it runs out - call if I am sick and can't take my medicine - keep a list of all the medicines I take; vitamins and herbals too - learn to read medicine labels - use a pillbox to sort medicine -  use an alarm clock or phone to remind me to take my medicine    Why is this important?   These steps will help you keep on track with your medicines.   Notes: 01-16-2021: The patient now has his medications and is taking as prescribed. Contact information given for RNCM for questions, concerns or needs. Will continue to monitor for changes. 03-01-2021: The patient is taking medications as prescribed. Did discuss the recommendations of pcp for taking atorvastatin 10 mg for help with cholesterol readings and LDL being 106 with goal of <70.  The patient did not want to take another pill but did say if the pcp recommend he would take it. In basket message sent to the pcp to send in order to [facility] so the patient could pick up  his other medications as well.      RNCM: Monitor and Manage My Blood Sugar-Diabetes Type 2       Timeframe:  Short-Term Goal Priority:  High Start Date:     01-16-2021                        Expected End Date:      06-18-2021                 Follow Up Date: 05-21-2021   - check blood sugar at prescribed times - check blood sugar if I feel it is too high or too low - enter blood sugar readings and medication or insulin into daily log - take the blood sugar meter to all doctor visits    Why is this important?   Checking your blood sugar at home helps to keep it from getting very high or very low.  Writing the results in a diary or log helps the doctor know how to care for you.  Your blood sugar log should have the time, date and the results.  Also, write down the amount of insulin or other medicine that you take.  Other information, like what you ate, exercise done and how you were feeling, will also be helpful.     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 The patient states that he does on the weekend and fasting it is usually around 115 and post prandial 165 to 180. Confirms compliance  with medications at this time. 03-01-2021: Praised for hemoglobin A1C of 6.6 from 9.3. The patient states that fasting his blood sugars have been around 115 and normal and after eating 180 or less. Has given up Dr. Reino Kent and this has helped considerably. Will continue to monitor.         Patient verbalizes understanding of instructions provided today and agrees to view in MyChart.   Telephone follow up appointment with care management team member scheduled for: 05-21-2021 at 0900 am  Alto Denver RN, MSN, CCM Community Care Coordinator [place]  [facility] Mobile: [phone]

## 2021-03-05 ENCOUNTER — Ambulatory Visit: Payer: Self-pay

## 2021-03-05 ENCOUNTER — Telehealth: Payer: Self-pay

## 2021-03-05 DIAGNOSIS — E1129 Type 2 diabetes mellitus with other diabetic kidney complication: Secondary | ICD-10-CM

## 2021-03-05 DIAGNOSIS — E1159 Type 2 diabetes mellitus with other circulatory complications: Secondary | ICD-10-CM

## 2021-03-05 DIAGNOSIS — E1169 Type 2 diabetes mellitus with other specified complication: Secondary | ICD-10-CM

## 2021-03-05 NOTE — Telephone Encounter (Signed)
  Chronic Care Management   Note  03/05/2021 Name: Joseph Burch MRN: [id number] DOB: 10/08/1970  Was able to contact the patient and complete the call. See new encounter.   Follow up plan: Telephone follow up appointment with care management team member scheduled for: 05-21-2021 Alto Denver RN, MSN, CCM Community Care Coordinator [place]  [facility] Mobile: [phone]

## 2021-03-05 NOTE — Patient Instructions (Signed)
Visit Information   Goals Addressed             This Visit's Progress    RNCM: Manage My Diet       Timeframe:  Long-Range Goal Priority:  High Start Date:    01-16-2021                         Expected End Date:     01-16-2022                  Follow Up Date : 05-21-2021 - ask for help if I have trouble affording healthy foods - choose foods that are low in sodium (salt) - eat 3 to 5 servings of fruits and vegetables each day - prepare or eat main meal at home 3 to 5 days each week - read food labels for sodium (salt), fat and sugar content    Why is this important?   A healthy diet is important for mental and physical health.  Healthy food helps repair damaged body tissue and maintains strong bones and muscles.  No single food is just right so eating a variety of proteins, fruits, vegetables and grains is best.  You may need to change what you eat or drink to manage kidney disease.  A dietitian is the best person to guide you.     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  He is staying away from potatoes, breads, and pastas. Will send some educational information on heart heatlhy/ADA diet for the patient. Will continue to monitor for changes. 03-01-2021: The patient is being more mindful of his eating habits while being out on the road. Cannot always eat healthy but has decided to stop drinking Dr. Reino Kent. He says this has helped a lot. Discussed other drink option with zero sugars. 03-05-2021: Discussed eating foods low in cholesterol. The patient likes bacon and cooks in the air fryer. The patient also eats quail eggs. The patient states that he is going to be more mindful of his food choices. Will continue to monitor.      RNCM: Manage My Medicine       Timeframe:  Short-Term Goal Priority:  High Start Date:       01-16-2021                      Expected End Date:     06-18-2021                  Follow Up Date 05-21-2021   -  call for medicine refill 2 or 3 days before it runs out - call if I am sick and can't take my medicine - keep a list of all the medicines I take; vitamins and herbals too - learn to read medicine labels - use a pillbox to sort medicine - use an alarm clock or phone to remind me to take my medicine    Why is this important?   These steps will help you keep on track with your medicines.   Notes: 01-16-2021: The patient now has his medications and is taking as prescribed. Contact information given for RNCM for questions, concerns or needs. Will continue to monitor for changes. 03-01-2021: The patient is taking medications as prescribed. Did discuss the recommendations of pcp for taking atorvastatin 10 mg for help with cholesterol readings and LDL being 106 with goal  of <70.  The patient did not want to take another pill but did say if the pcp recommend he would take it. In basket message sent to the pcp to send in order to [facility] so the patient could pick up his other medications as well. 03-05-2021: The patient started taking atorvastatin 10 mg. Took it for 2 days and was having a throbbing behind his eyes in his head and forehead. He had to go to bed. The patient states he will just work on dietary changes. Has stopped taking atorvastatin.      RNCM: Monitor and Manage My Blood Sugar-Diabetes Type 2       Timeframe:  Short-Term Goal Priority:  High Start Date:     01-16-2021                        Expected End Date:      06-18-2021                 Follow Up Date: 05-21-2021   - check blood sugar at prescribed times - check blood sugar if I feel it is too high or too low - enter blood sugar readings and medication or insulin into daily log - take the blood sugar meter to all doctor visits    Why is this important?   Checking your blood sugar at home helps to keep it from getting very high or very low.  Writing the results in a diary or log helps the doctor know how to care for you.  Your blood  sugar log should have the time, date and the results.  Also, write down the amount of insulin or other medicine that you take.  Other information, like what you ate, exercise done and how you were feeling, will also be helpful.     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 The patient states that he does on the weekend and fasting it is usually around 115 and post prandial 165 to 180. Confirms compliance with medications at this time. 03-01-2021: Praised for hemoglobin A1C of 6.6 from 9.3. The patient states that fasting his blood sugars have been around 115 and normal and after eating 180 or less. Has given up Dr. Reino Kent and this has helped considerably. Will continue to monitor. 03-05-2021: Blood sugar this am was 112.        Patient verbalizes understanding of instructions provided today and agrees to view in MyChart.   Telephone follow up appointment with care management team member scheduled for: 05-21-2021 at 0900 am  Alto Denver RN, MSN, CCM Community Care Coordinator [place]  [facility] Mobile: [phone]

## 2021-03-05 NOTE — Chronic Care Management (AMB) (Signed)
Care Management    RN Visit Note  03/05/2021 Name: Joseph Burch MRN: [id number] DOB: 1971-02-20  Subjective: Joseph Burch is a 50 y.o. year old male who is a primary care patient of Cannady, Barbaraann Faster, NP. The care management team was consulted for assistance with disease management and care coordination needs.    Engaged with patient by telephone for follow up visit in response to provider referral for case management and/or care coordination services.   Consent to Services:   Joseph Burch was given information about Care Management services today including:  Care Management services includes personalized support from designated clinical staff supervised by his physician, including individualized plan of care and coordination with other care providers 24/7 contact phone numbers for assistance for urgent and routine care needs. The patient may stop case management services at any time by phone call to the office staff.  Patient agreed to services and consent obtained.   Assessment: Review of patient past medical history, allergies, medications, health status, including review of consultants reports, laboratory and other test data, was performed as part of comprehensive evaluation and provision of chronic care management services.   SDOH (Social Determinants of Health) assessments and interventions performed:    Care Plan  Allergies  Allergen Reactions   Guaifenesin Itching   Codeine Rash   Other Rash    Outpatient Encounter Medications as of 03/05/2021  Medication Sig Note   amLODipine (NORVASC) 10 MG tablet Take 1 tablet (10 mg total) by mouth daily.    aspirin EC 81 MG tablet Take 81 mg by mouth daily. Swallow whole.    atorvastatin (LIPITOR) 10 MG tablet Take 1 tablet (10 mg total) by mouth daily. (Patient not taking: Reported on 03/05/2021) 03/05/2021: Started taking. Took 2 days and had throbbing behind his eyes and forehead. Only new thing that he had done and he had to go to bed.  The patient stopped taking. RNCM will let the pcp know.    [employer] Lancets Thin 26G MISC To check blood sugar 2-3 times daily.    ertugliflozin L-PyroglutamicAc (STEGLATRO) 5 MG TABS tablet Take 1 tablet (5 mg total) by mouth daily.    glucose blood ([employer]S GLUCOSE METER TEST STRIPS) test strip To check blood sugar 2-3 times daily.    metFORMIN (GLUCOPHAGE XR) 500 MG 24 hr tablet Take 2 tablets (1,000 mg total) by mouth 2 (two) times daily.    No facility-administered encounter medications on file as of 03/05/2021.    Patient Active Problem List   Diagnosis Date Noted   Tinnitus of both ears 02/08/2021   Allergic rhinitis 11/07/2020   BMI 29.0-29.9,adult 11/07/2020   Hyperlipidemia associated with type 2 diabetes mellitus ([place]) 11/03/2020   Screening for prostate cancer 02/13/2020   Hypertension associated with diabetes ([place]) 11/22/2019   Type 2 diabetes mellitus with proteinuria ([place]) 11/22/2019   Deformity of sternum 11/22/2019   Asymptomatic microscopic hematuria 11/22/2019    Conditions to be addressed/monitored: HTN, HLD, and DMII  Care Plan : RNCM: Management of Chronic Conditions: HTN, HLD, DM2  Updates made by Vanita Ingles, RN since 03/05/2021 12:00 AM     Problem: RNCM: Chronic disese management care plan for HTN, HLD, DM2   Priority: High     Long-Range Goal: RNCM: Management of HTN, HLD, DM2   Start Date: 01/16/2021  Expected End Date: 01/16/2022  This Visit's Progress: On track  Recent Progress: On track  Priority: High  Note:   Current Barriers:  Knowledge Deficits related to plan of care for management of HTN, HLD, and DMII  Care Coordination needs related to Level of care concerns in a patient with HTN, HLD, and DMII Chronic Disease Management support and education needs related to HTN, HLD, and DMII Non-adherence to prescribed medication regimen  RNCM Clinical Goal(s):  Patient will verbalize understanding of plan for management of HTN, HLD, and DMII work with RN  Case Manager to address needs related to HTN, HLD, and DMII and Level of care concerns take all medications exactly as prescribed and will call provider for medication related questions attend all scheduled medical appointments: 05-17-2021 demonstrate a decrease in HTN, HLD, and DMII exacerbations demonstrate improved adherence to prescribed treatment plan for HTN, HLD, and DMII demonstrate improved health management independence verbalize basic understanding of HTN, HLD, and DMII disease process and self health management plan demonstrate understanding of rationale for each prescribed medication demonstrate ongoing self health care management ability through collaboration with RN Care manager, provider, and care team.   Interventions: 1:1 collaboration with Marnee Guarneri, NP, regarding development and update of comprehensive plan of care as evidenced by provider attestation and co-signature Inter-disciplinary care team collaboration (see longitudinal plan of care)   Diabetes:  (Status: Goal on track: YES.) Lab Results  Component Value Date   HGBA1C 6.6 02/08/2021  *A1C on DOT physical on 11-02-2020 was 9.3- on 02-08-2021 new value 6.6% Assessed patient's understanding of A1c goal: <7% Provided education to patient about basic DM disease process; Reviewed medications with patient and discussed importance of medication adherence, patient states compliance with medications. Patient had been off of medications x 3 months but is consistently taking; Counseled on importance of regular laboratory monitoring as prescribed, discussed hemoglobin A1C goal and likelihood of testing at next appointment with pcp. The patient is hopeful that his A1C is going to be less than 9.3. 03-01-2021: Newest A1C level is 6.6 on 02-08-2021 Discussed plans with patient for ongoing care management follow up and provided patient with direct contact information for care management team; Provided patient with written  educational materials related to hypo and hyperglycemia and importance of correct treatment- will send information through [facility] and My chart; Reviewed scheduled/upcoming provider appointments including: 05-17-2021 Advised patient, providing education and rationale, to check cbg bid and record, calling pcp for findings outside established parameters, the patient is checking his blood sugars on the weekends. 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  Encouraged the patient to check as much as possible when he is out on the road. Patient reports fasting is usually around 115 and post prandial numbers are 165-180. Review of goals of fasting <130 and post prandial of <180, also discussed 15 minutes of moderate exercise could help the patient with lowering blood sugars. The patient advised it is hard to have an exercise routine on the road. 03-01-2021: The patient checks his blood sugars when he is home. He states fasting it is 115 in the mornings and usually always <180 after eating. States he eats a lot of chicken. Does like to eat bacon. Discussed healthier options and only eating bacon once in a while. 03-05-2021: The patient states when he was having bad headache after taking atorvastatin his blood sugar was 112.   Review of patient status, including review of consultants reports, relevant laboratory and other test results, and medications completed; Screening for signs and symptoms of depression related to chronic disease state;  Assessed social determinant of health barriers;   Hyperlipidemia:  (Status: Goal on track: NO.) Medication review performed; medication list updated in electronic medical record.  Provider established cholesterol goals reviewed, 03-01-2021: Reviewed the labwork and notes from the pcp on 02-08-2021. The patients LDL is 106 and goal is <70.  The patient does not want to take another pill but states if his pcp feels like this is something  he needs to do he will do it. Recommended dietary changes as well to help with lowering of LDL.  Education and support given. The patient agrees to start the atorvastatin 10 mg and ask that it be sent in to [place] and he will pick up with his other refills. In basket message sent to pcp with request after review of the pcp notes with the patient. Advised the patient to call with any questions or concerns.  03-05-2021: The patient called the RNCM and states he cannot take the new medication for cholesterol. He took it for 2 days and states he had throbbing pain behind his eyes and forehead both days  he took the medication. He took it for 2 days and it did this both days. The patient has stopped taking the medication and states he cannot function like this. The patient states he will work on his diet and try to get levels down by dietary measures. The patient states he had to go to bed for 3 or 4 hours just to function again. Blood pressures was 135/80 and all other vital signs were in normal limits. The patient talked about fixing his bacon in an air fryer and eating quail eggs. Will notify the pcp of changes and the patient stopping the medication.  Counseled on importance of regular laboratory monitoring as prescribed; Provided HLD educational materials; Reviewed role and benefits of statin for ASCVD risk reduction; Discussed strategies to manage statin-induced myalgias; Reviewed importance of limiting foods high in cholesterol. 03-05-2021: Reviewed and gave suggestions for foods that support heart health and lower cholesterol;  Screening for signs and symptoms of depression related to chronic disease state;  Assessed social determinant of health barriers;   Hypertension: (Status: Goal on track: YES.) Last practice recorded BP readings:  BP Readings from Last 3 Encounters:  02/08/21 128/74  12/07/20 122/84  11/07/20 (!) 146/88  Reading when having issues with atorvastatin was 135/80 Most  recent eGFR/CrCl: No results found for: EGFR  No components found for: CRCL  Evaluation of current treatment plan related to hypertension self management and patient's adherence to plan as established by provider. 03-01-2021: The patient has more stable blood pressures at this time. Denies any acute needs. States that he is taking his medications as prescribed. No new concerns with HTN management;   Provided education to patient re: stroke prevention, s/s of heart attack and stroke; Reviewed medications with patient and discussed importance of compliance;  Discussed plans with patient for ongoing care management follow up and provided patient with direct contact information for care management team; Advised patient, providing education and rationale, to monitor blood pressure daily and record, calling PCP for findings outside established parameters;  Reviewed scheduled/upcoming provider appointments including, 05-17-2021 for follow up with the pcp   Provided education on prescribed diet heart healthy/ADA. 03-05-2021: Review with the patient- recommendations provided;  Discussed complications of poorly controlled blood pressure such as heart disease, stroke, circulatory complications, vision complications, kidney impairment, sexual dysfunction;   Patient Goals/Self-Care Activities: Patient will self administer medications as prescribed Patient will  attend all scheduled provider appointments Patient will call pharmacy for medication refills Patient will attend church or other social activities Patient will continue to perform ADL's independently Patient will continue to perform IADL's independently Patient will call provider office for new concerns or questions Patient will work with BSW to address care coordination needs and will continue to work with the clinical team to address health care and disease management related needs.    Follow Up Plan: Telephone follow up appointment with care management  team member scheduled for: 05-21-2021 at 900 am     Plan: Telephone follow up appointment with care management team member scheduled for:  05-21-2021 at 0900 am  Noreene Larsson RN, MSN, [facility] Mobile: [phone]

## 2021-05-03 ENCOUNTER — Telehealth: Payer: 59

## 2021-05-17 ENCOUNTER — Ambulatory Visit (INDEPENDENT_AMBULATORY_CARE_PROVIDER_SITE_OTHER): Payer: 59 | Admitting: Nurse Practitioner

## 2021-05-17 ENCOUNTER — Other Ambulatory Visit: Payer: Self-pay

## 2021-05-17 ENCOUNTER — Encounter: Payer: Self-pay | Admitting: Nurse Practitioner

## 2021-05-17 VITALS — BP 114/75 | HR 65 | Temp 97.9°F | Wt 173.6 lb

## 2021-05-17 DIAGNOSIS — I152 Hypertension secondary to endocrine disorders: Secondary | ICD-10-CM

## 2021-05-17 DIAGNOSIS — B351 Tinea unguium: Secondary | ICD-10-CM

## 2021-05-17 DIAGNOSIS — R809 Proteinuria, unspecified: Secondary | ICD-10-CM

## 2021-05-17 DIAGNOSIS — E1129 Type 2 diabetes mellitus with other diabetic kidney complication: Secondary | ICD-10-CM | POA: Diagnosis not present

## 2021-05-17 DIAGNOSIS — E1159 Type 2 diabetes mellitus with other circulatory complications: Secondary | ICD-10-CM

## 2021-05-17 DIAGNOSIS — E1169 Type 2 diabetes mellitus with other specified complication: Secondary | ICD-10-CM

## 2021-05-17 DIAGNOSIS — E785 Hyperlipidemia, unspecified: Secondary | ICD-10-CM

## 2021-05-17 LAB — BAYER DCA HB A1C WAIVED: HB A1C (BAYER DCA - WAIVED): 6.4 % — ABNORMAL HIGH (ref 4.8–5.6)

## 2021-05-17 NOTE — Assessment & Plan Note (Signed)
Chronic, ongoing.   A1c today 6.4%, much improved from initial 9.3%. Praised for success. - At this time will continue Metformin XR 1000 MG BID and Steglatro 5 MG as offering benefit - CCM assistance in place. - Recommend he monitor BS 2-3 times daily with goal fasting <130 and goal 2 hours post meal <180.  Document and bring to visits.   Return in April for DOT.

## 2021-05-17 NOTE — Assessment & Plan Note (Signed)
Referral to podiatry for foot care and recommendations.

## 2021-05-17 NOTE — Assessment & Plan Note (Addendum)
Chronic, ongoing with BP at goal today.  Did not tolerate ARB in past, although would benefit from ACE or ARB with his diabetes and urine ALB 30 recent check, consider low dose ACE trial in future.  Recommend he monitor BP at least a few mornings a week at home and document.  DASH diet at home.  Continue current medication regimen and adjust as needed.  Labs: BMP.

## 2021-05-17 NOTE — Progress Notes (Signed)
BP 114/75   Pulse 65   Temp 97.9 F (36.6 C) (Oral)   Wt 173 lb 9.6 oz (78.7 kg)   SpO2 95%   BMI 28.44 kg/m    Subjective:    Patient ID: Joseph Burch, male    DOB: Aug 15, 1970, 50 y.o.   MRN: [id number]  HPI: Joseph Burch is a 50 y.o. male  Chief Complaint  Patient presents with   Diabetes    Patient denies having a recent Diabetic Eye Exam. Patient states he has never had any issues with his eye sight other than being sleepy.    Hyperlipidemia   Hypertension   DOT due Nov 03, 2021  DIABETES A1c in August was 6.6%.  Continues on Steglatro 5 MG daily and taking Metformin XR 1000 MG BID.  Is doing keto bread at home.   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 Gets annual DOT assessments. Hypoglycemic episodes:no Polydipsia/polyuria: no Visual disturbance: no Chest pain: no Paresthesias: no Glucose Monitoring: no             Accucheck frequency: occasionally             Fasting glucose:              Post prandial:             Evening:             Before meals: Taking Insulin?: no             Long acting insulin:             Short acting insulin: Blood Pressure Monitoring: a few times a week Retinal Examination: done at DOT physical, but no official exam Foot Exam: Up to Date Pneumovax: refuses Influenza: refuses Aspirin: yes    HYPERTENSION / HYPERLIPIDEMIA Continues on Amlodipine 10 MG, tried Atorvastatin in past which caused a splitting headache.    Is a past smoker, quit 9 years ago -- smoked 1 to 1/2 PPD.  Has taken Losartan in past -- made him feel loopy. Satisfied with current treatment? yes Duration of hypertension: chronic BP monitoring frequency: a few times a week BP range:  BP medication side effects: no Duration of hyperlipidemia: chronic Aspirin: no Recent stressors: no Recurrent headaches: no Visual changes: no Palpitations: no Dyspnea: no Chest pain: no Lower extremity edema: no Dizzy/lightheaded: no    Relevant past medical, surgical, family and social history reviewed and updated as indicated. Interim medical history since our last visit reviewed. Allergies and medications reviewed and updated.  Review of Systems  Per HPI unless specifically indicated above     Objective:    BP 114/75   Pulse 65   Temp 97.9 F (36.6 C) (Oral)   Wt 173 lb 9.6 oz (78.7 kg)   SpO2 95%   BMI 28.44 kg/m   Wt Readings from Last 3 Encounters:  05/17/21 173 lb 9.6 oz (78.7 kg)  02/08/21 171 lb 6.4 oz (77.7 kg)  12/07/20 173 lb 3.2 oz (78.6 kg)    Physical Exam Vitals and nursing note reviewed.  Constitutional:      General: He is awake. He is not in acute distress.    Appearance: He is well-developed and well-groomed. He is not ill-appearing or toxic-appearing.  HENT:     Head: Normocephalic and atraumatic.     Right Ear: Hearing, tympanic membrane, ear canal and external ear normal. No drainage.  Left Ear: Hearing, tympanic membrane, ear canal and external ear normal. No drainage.  Eyes:     General: Lids are normal.        Right eye: No discharge.        Left eye: No discharge.     Conjunctiva/sclera: Conjunctivae normal.     Pupils: Pupils are equal, round, and reactive to light.  Neck:     Thyroid: No thyromegaly.     Vascular: No carotid bruit.  Cardiovascular:     Rate and Rhythm: Normal rate and regular rhythm.     Heart sounds: Normal heart sounds, S1 normal and S2 normal. No murmur heard.   No gallop.  Pulmonary:     Effort: Pulmonary effort is normal. No accessory muscle usage or respiratory distress.     Breath sounds: Normal breath sounds.  Abdominal:     General: Bowel sounds are normal.     Palpations: Abdomen is soft. There is no hepatomegaly or splenomegaly.  Musculoskeletal:        General: Normal range of motion.     Cervical back: Normal range of motion and neck supple.     Right lower leg: No edema.     Left lower leg: No edema.  Lymphadenopathy:      Cervical: No cervical adenopathy.  Skin:    General: Skin is warm and dry.     Capillary Refill: Capillary refill takes less than 2 seconds.     Findings: No rash.  Neurological:     Mental Status: He is alert and oriented to person, place, and time.     Deep Tendon Reflexes: Reflexes are normal and symmetric.  Psychiatric:        Attention and Perception: Attention normal.        Mood and Affect: Mood normal.        Speech: Speech normal.        Behavior: Behavior normal. Behavior is cooperative.        Thought Content: Thought content normal.   Diabetic Foot Exam - Simple   Simple Foot Form Visual Inspection See comments: Yes Sensation Testing Intact to touch and monofilament testing bilaterally: Yes Pulse Check Posterior Tibialis and Dorsalis pulse intact bilaterally: Yes Comments Fungal disease to bilateral toenails.    Results for orders placed or performed in visit on 02/08/21  Bayer DCA Hb A1c Waived  Result Value Ref Range   HB A1C (BAYER DCA - WAIVED) 6.6 <7.0 %  Comprehensive metabolic panel  Result Value Ref Range   Glucose 172 (H) 65 - 99 mg/dL   BUN 14 6 - 24 mg/dL   Creatinine, Ser 1.07 0.76 - 1.27 mg/dL   eGFR 83 >59 mL/min/1.73   BUN/Creatinine Ratio 13 9 - 20   Sodium 140 134 - 144 mmol/L   Potassium 4.3 3.5 - 5.2 mmol/L   Chloride 100 96 - 106 mmol/L   CO2 21 20 - 29 mmol/L   Calcium 9.9 8.7 - 10.2 mg/dL   Total Protein 7.0 6.0 - 8.5 g/dL   Albumin 4.6 3.8 - 4.9 g/dL   Globulin, Total 2.4 1.5 - 4.5 g/dL   Albumin/Globulin Ratio 1.9 1.2 - 2.2   Bilirubin Total 0.5 0.0 - 1.2 mg/dL   Alkaline Phosphatase 107 44 - 121 IU/L   AST 20 0 - 40 IU/L   ALT 28 0 - 44 IU/L  CBC with Differential/Platelet  Result Value Ref Range   WBC 4.2 3.4 - 10.8 x10E3/uL  RBC 5.62 4.14 - 5.80 x10E6/uL   Hemoglobin 16.8 13.0 - 17.7 g/dL   Hematocrit 49.1 37.5 - 51.0 %   MCV 87 79 - 97 fL   MCH 29.9 26.6 - 33.0 pg   MCHC 34.2 31.5 - 35.7 g/dL   RDW 12.4 11.6 - 15.4 %    Platelets 270 150 - 450 x10E3/uL   Neutrophils 45 Not Estab. %   Lymphs 36 Not Estab. %   Monocytes 13 Not Estab. %   Eos 4 Not Estab. %   Basos 1 Not Estab. %   Neutrophils Absolute 1.9 1.4 - 7.0 x10E3/uL   Lymphocytes Absolute 1.5 0.7 - 3.1 x10E3/uL   Monocytes Absolute 0.5 0.1 - 0.9 x10E3/uL   EOS (ABSOLUTE) 0.2 0.0 - 0.4 x10E3/uL   Basophils Absolute 0.0 0.0 - 0.2 x10E3/uL   Immature Granulocytes 1 Not Estab. %   Immature Grans (Abs) 0.0 0.0 - 0.1 x10E3/uL  Lipid Panel w/o Chol/HDL Ratio  Result Value Ref Range   Cholesterol, Total 166 100 - 199 mg/dL   Triglycerides 139 0 - 149 mg/dL   HDL 39 (L) >39 mg/dL   VLDL Cholesterol Cal 25 5 - 40 mg/dL   LDL Chol Calc (NIH) 102 (H) 0 - 99 mg/dL  TSH  Result Value Ref Range   TSH 1.080 0.450 - 4.500 uIU/mL  PSA  Result Value Ref Range   Prostate Specific Ag, Serum 0.7 0.0 - 4.0 ng/mL  Microalbumin, Urine Waived  Result Value Ref Range   Microalb, Ur Waived 30 (H) 0 - 19 mg/L   Creatinine, Urine Waived 50 10 - 300 mg/dL   Microalb/Creat Ratio <30 <30 mg/g      Assessment & Plan:   Problem List Items Addressed This Visit       Cardiovascular and Mediastinum   Hypertension associated with diabetes ([place])    Chronic, ongoing with BP at goal today.  Did not tolerate ARB in past, although would benefit from ACE or ARB with his diabetes and urine ALB 30 recent check, consider low dose ACE trial in future.  Recommend he monitor BP at least a few mornings a week at home and document.  DASH diet at home.  Continue current medication regimen and adjust as needed.  Labs: BMP.         Relevant Orders   Bayer DCA Hb A1c Waived   Basic metabolic panel     Endocrine   Hyperlipidemia associated with type 2 diabetes mellitus ([place])    Did not tolerate Atorvastatin, will check lipid panel today -- if elevations dicussed with him guidelines for diabetes and will send in Rosuvastatin 10 MG to take 3 days a week, awaiting labs.       Relevant Orders   Bayer DCA Hb A1c Waived   Lipid Panel w/o Chol/HDL Ratio   Type 2 diabetes mellitus with proteinuria ([facility]) - Primary    Chronic, ongoing.   A1c today 6.4%, much improved from initial 9.3%. Praised for success. - At this time will continue Metformin XR 1000 MG BID and Steglatro 5 MG as offering benefit - CCM assistance in place. - Recommend he monitor BS 2-3 times daily with goal fasting <130 and goal 2 hours post meal <180.  Document and bring to visits.   Return in April for DOT.      Relevant Orders   Bayer [place] Hb A1c Waived   Ambulatory referral to Podiatry   Ambulatory referral to Ophthalmology  Musculoskeletal and Integument   Onychomycosis    Referral to podiatry for foot care and recommendations.      Relevant Orders   Ambulatory referral to Podiatry     Follow up plan: Return in about 20 weeks (around 10/04/2021) for DOT and DIABETES check.

## 2021-05-17 NOTE — Assessment & Plan Note (Signed)
Did not tolerate Atorvastatin, will check lipid panel today -- if elevations dicussed with him guidelines for diabetes and will send in Rosuvastatin 10 MG to take 3 days a week, awaiting labs.

## 2021-05-17 NOTE — Patient Instructions (Signed)

## 2021-05-18 LAB — BASIC METABOLIC PANEL
BUN/Creatinine Ratio: 17 (ref 9–20)
BUN: 18 mg/dL (ref 6–24)
CO2: 23 mmol/L (ref 20–29)
Calcium: 9.8 mg/dL (ref 8.7–10.2)
Chloride: 101 mmol/L (ref 96–106)
Creatinine, Ser: 1.07 mg/dL (ref 0.76–1.27)
Glucose: 149 mg/dL — ABNORMAL HIGH (ref 70–99)
Potassium: 4.3 mmol/L (ref 3.5–5.2)
Sodium: 143 mmol/L (ref 134–144)
eGFR: 83 mL/min/{1.73_m2} (ref >59)

## 2021-05-18 LAB — LIPID PANEL W/O CHOL/HDL RATIO
Cholesterol, Total: 179 mg/dL (ref 100–199)
HDL: 44 mg/dL (ref >39)
LDL Chol Calc (NIH): 106 mg/dL — ABNORMAL HIGH (ref 0–99)
Triglycerides: 165 mg/dL — ABNORMAL HIGH (ref 0–149)
VLDL Cholesterol Cal: 29 mg/dL (ref 5–40)

## 2021-05-19 MED ORDER — ROSUVASTATIN CALCIUM 10 MG PO TABS: ORAL_TABLET | ORAL | 4 refills | Status: DC

## 2021-05-19 NOTE — Addendum Note (Signed)
Addended by: Aura Dials T on: 05/19/2021 09:01 AM   Modules accepted: Orders

## 2021-05-19 NOTE — Progress Notes (Signed)
Contacted via [place] [facility], your labs have returned: - Kidney function, creatinine and eGFR, remains stable. - Cholesterol levels are above goal, as we discussed I have sent in Rosuvastatin for you to start taking 10 MG three days a week.  If any side effects with this, let me know.  Any questions? Keep being stellar!!  Thank you for allowing me to participate in your care.  I appreciate you. Kindest regards, Floretta Petro

## 2021-05-21 ENCOUNTER — Ambulatory Visit: Payer: Self-pay

## 2021-05-21 ENCOUNTER — Telehealth: Payer: 59 | Admitting: General Practice

## 2021-05-21 DIAGNOSIS — E1159 Type 2 diabetes mellitus with other circulatory complications: Secondary | ICD-10-CM

## 2021-05-21 DIAGNOSIS — I152 Hypertension secondary to endocrine disorders: Secondary | ICD-10-CM

## 2021-05-21 DIAGNOSIS — E1129 Type 2 diabetes mellitus with other diabetic kidney complication: Secondary | ICD-10-CM

## 2021-05-21 DIAGNOSIS — E785 Hyperlipidemia, unspecified: Secondary | ICD-10-CM

## 2021-05-21 DIAGNOSIS — E1169 Type 2 diabetes mellitus with other specified complication: Secondary | ICD-10-CM

## 2021-05-21 DIAGNOSIS — R809 Proteinuria, unspecified: Secondary | ICD-10-CM

## 2021-05-21 NOTE — Chronic Care Management (AMB) (Signed)
Care Management    RN Visit Note  05/21/2021 Name: Joseph Burch MRN: [id number] DOB: December 29, 1970  Subjective: Joseph Burch is a 50 y.o. year old male who is a primary care patient of Cannady, Barbaraann Faster, NP. The care management team was consulted for assistance with disease management and care coordination needs.    Engaged with patient by telephone for follow up visit in response to provider referral for case management and/or care coordination services.   Consent to Services:   Joseph Burch was given information about Care Management services today including:  Care Management services includes personalized support from designated clinical staff supervised by his physician, including individualized plan of care and coordination with other care providers 24/7 contact phone numbers for assistance for urgent and routine care needs. The patient may stop case management services at any time by phone call to the office staff.  Patient agreed to services and consent obtained.   Assessment: Review of patient past medical history, allergies, medications, health status, including review of consultants reports, laboratory and other test data, was performed as part of comprehensive evaluation and provision of chronic care management services.   SDOH (Social Determinants of Health) assessments and interventions performed:    Care Plan  Allergies  Allergen Reactions   Atorvastatin Other (See Comments)    Headache side effect   Guaifenesin Itching   Codeine Rash   Other Rash    Outpatient Encounter Medications as of 05/21/2021  Medication Sig   amLODipine (NORVASC) 10 MG tablet Take 1 tablet (10 mg total) by mouth daily.   aspirin EC 81 MG tablet Take 81 mg by mouth daily. Swallow whole. (Patient not taking: Reported on 05/17/2021)   [employer] Lancets Thin 26G MISC To check blood sugar 2-3 times daily.   ertugliflozin L-PyroglutamicAc (STEGLATRO) 5 MG TABS tablet Take 1 tablet (5 mg total) by mouth  daily.   glucose blood ([employer] GLUCOSE METER TEST STRIPS) test strip To check blood sugar 2-3 times daily.   metFORMIN (GLUCOPHAGE XR) 500 MG 24 hr tablet Take 2 tablets (1,000 mg total) by mouth 2 (two) times daily.   rosuvastatin (CRESTOR) 10 MG tablet Take one tablet (10 MG) by mouth three days a week; Monday, Wednesday, Friday.   No facility-administered encounter medications on file as of 05/21/2021.    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    Conditions to be addressed/monitored: HTN, HLD, and DMII  Care Plan : RNCM: Management of Chronic Conditions: HTN, HLD, DM2  Updates made by Vanita Ingles, RN since 05/21/2021 12:00 AM     Problem: RNCM: Chronic disese management care plan for HTN, HLD, DM2   Priority: High     Long-Range Goal: RNCM: Management of HTN, HLD, DM2   Start Date: 01/16/2021  Expected End Date: 01/16/2022  Recent Progress: On track  Priority: High  Note:   Current Barriers:  Knowledge Deficits related to plan of care for management of HTN, HLD, and DMII  Care Coordination needs related to Level of care concerns in a patient with HTN, HLD, and DMII Chronic Disease Management support and education needs related to HTN, HLD,  and DMII Non-adherence to prescribed medication regimen  RNCM Clinical Goal(s):  Patient will verbalize understanding of plan for management of HTN, HLD, and DMII work with RN Case Manager to address needs related to HTN, HLD, and DMII and Level of care concerns take all medications exactly as prescribed and will call provider for medication related questions attend all  scheduled medical appointments: 10-11-2021 demonstrate a decrease in HTN, HLD, and DMII exacerbations demonstrate improved adherence to prescribed treatment plan for HTN, HLD, and DMII demonstrate improved health management independence verbalize basic understanding of HTN, HLD, and DMII disease process and self health management plan demonstrate understanding of rationale for each prescribed medication demonstrate ongoing self health care management ability through collaboration with RN Care manager, provider, and care team.   Interventions: 1:1 collaboration with Marnee Guarneri, NP, regarding development and update of comprehensive plan of care as evidenced by provider attestation and co-signature Inter-disciplinary care team collaboration (see longitudinal plan of care)   Diabetes:  (Status: Goal on track: YES.) Lab Results  Component Value Date   HGBA1C 6.4 (H) 05/17/2021  *A1C on DOT physical on 11-02-2020 was 9.3- on 02-08-2021 new value 6.6% Assessed patient's understanding of A1c goal: <7% Provided education to patient about basic DM disease process; Reviewed medications with patient and discussed importance of medication adherence, patient states compliance with medications. Patient had been off of medications x 3 months but is consistently taking. 05-21-2021: The patient is compliant with medications. His goal is to effectively manage his conditions and hopefully come off of some of the medications ; Counseled on importance of regular laboratory monitoring as prescribed, discussed hemoglobin A1C goal and likelihood of testing at next appointment with pcp. The patient is hopeful that his A1C is going to be less than 9.3. 03-01-2021: Newest A1C level is 6.6 on 02-08-2021. 05-21-2021: Newest A1C 6.4 % on 05-17-2021. Praised the patient for hard work on getting A1C levels down and monitoring blood sugars.  The patient states that he has lost about 20 pounds and he is also eating better. The  patient has made positive changes in his dietary habits.  Discussed plans with patient for ongoing care management follow up and provided patient with direct contact information for care management team; Provided patient with written educational materials related to hypo and hyperglycemia and importance of correct treatment- will send information through [facility] and My chart; Reviewed scheduled/upcoming provider appointments including: 10-11-2021 Advised patient, providing education and rationale, to check cbg bid and record, calling pcp for findings outside established parameters, the patient is checking his blood sugars on the weekends. 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  Encouraged the patient to check as much as possible when he is out on the road. Patient reports fasting is usually around 115 and post prandial numbers are 165-180. Review of goals of fasting <130 and post prandial of <180, also discussed 15 minutes of moderate exercise could help the patient with lowering blood sugars. The patient advised it is hard to have an exercise routine on the road. 03-01-2021: The patient checks his blood sugars when he is home. He states fasting it is 115 in the mornings and usually always <180 after eating. States he eats a lot of chicken. Does like to eat bacon. Discussed healthier options and only eating bacon once in a while. 03-05-2021: The patient states when he was having bad headache after taking atorvastatin his blood sugar was 112.  05-21-2021: The patient is doing well and has made positive changes to improve his health and well being. The patient states that he has lost 20 pounds and is also eating better. He has cut out sodas and mainly drinks water. The patient states that if he does have a soda he drinks sprite zero. Praised the patient for accomplishments. Encouraged the patient to continue on positive lifestyle changes. Will continue to monitor.   Review of patient status, including review of consultants reports, relevant laboratory and other test results, and medications completed; Screening for signs and symptoms of depression related to chronic disease state;  Assessed social determinant of health barriers;   Hyperlipidemia:  (Status: Goal on track: NO.) Lab Results  Component Value Date   CHOL 179 05/17/2021   HDL 44 05/17/2021   LDLCALC 106 (H) 05/17/2021   TRIG 165 (H) 05/17/2021   CHOLHDL 4 11/21/2019    Medication review performed; medication list updated in electronic medical record.  Provider established cholesterol goals reviewed, 03-01-2021: Reviewed the labwork and notes from the pcp on 02-08-2021. The patients LDL is 106 and goal is <70.  The patient does not want to take another pill but states if his pcp feels like this is something he needs to do he will do it. Recommended dietary changes as well to help with lowering of LDL.  Education and support given. The patient agrees to start the atorvastatin 10 mg and ask that it be sent in to [place] and he will pick up with his other refills. In basket message sent to pcp with request after review of the pcp notes with the patient. Advised the patient to call with any questions or concerns.  03-05-2021: The patient called the RNCM and states he cannot take the new medication for cholesterol. He took it for 2 days and states he had throbbing pain behind his eyes and forehead both days  he took the medication. He took it for 2 days and it did this both days. The patient has stopped taking the medication and states he cannot function like this. The patient states he will work on his diet and try to get levels down by dietary measures. The patient states he had to go to bed for 3 or 4 hours just to function again. Blood pressures was 135/80 and all other vital signs were in normal limits. The patient talked about fixing his bacon in an air fryer and eating quail eggs. Will notify the  pcp of changes and the patient stopping the medication. 05-21-2021: The patient has a new script for Crestor (Rosuvastatin 10 mg) to take 3 times a week for his cholesterol. The patient has not picked it up yet but will when he gets in tomorrow. The patient states that he hopes he doesn't have issues like he did with the other medication. Education and support given. The patient knows to call the office for changes in his condition or problems with medications Counseled on importance of regular laboratory monitoring as prescribed; Provided HLD educational materials; Reviewed role and benefits of statin for ASCVD risk reduction; Discussed strategies to manage statin-induced myalgias; Reviewed importance of limiting foods high in cholesterol. 03-05-2021: Reviewed and gave suggestions for foods that support heart health and lower cholesterol. 05-21-2021: Reviewed foods that the patient has been eating. Praised for positive changes in food choices. ;  Screening for signs and symptoms of depression related to chronic disease state;  Assessed social determinant of health barriers;   Hypertension: (  Status: Goal on track: YES.) Last practice recorded BP readings:  BP Readings from Last 3 Encounters:  05/17/21 114/75  02/08/21 128/74  12/07/20 122/84  Reading when having issues with atorvastatin was 135/80 Most recent eGFR/CrCl: No results found for: EGFR  No components found for: CRCL  Evaluation of current treatment plan related to hypertension self management and patient's adherence to plan as established by provider. 05-21-2021: The patient has more stable blood pressures at this time. Denies any acute needs. States that he is taking his medications as prescribed. No new concerns with HTN management;   Provided education to patient re: stroke prevention, s/s of heart attack and stroke; Reviewed medications with patient and discussed importance of compliance;  Discussed plans with patient for ongoing  care management follow up and provided patient with direct contact information for care management team; Advised patient, providing education and rationale, to monitor blood pressure daily and record, calling PCP for findings outside established parameters;  Reviewed scheduled/upcoming provider appointments including, 10-11-2021 for follow up with the pcp   Provided education on prescribed diet heart healthy/ADA. 05-21-2021: Review with the patient- recommendations provided. Praised the patient for positive changes in dietary habits;  Discussed complications of poorly controlled blood pressure such as heart disease, stroke, circulatory complications, vision complications, kidney impairment, sexual dysfunction;   Patient Goals/Self-Care Activities: Patient will self administer medications as prescribed Patient will attend all scheduled provider appointments Patient will call pharmacy for medication refills Patient will attend church or other social activities Patient will continue to perform ADL's independently Patient will continue to perform IADL's independently Patient will call provider office for new concerns or questions Patient will work with BSW to address care coordination needs and will continue to work with the clinical team to address health care and disease management related needs.        Plan: Telephone follow up appointment with care management team member scheduled for:  07-23-2021 at 0900 am  Noreene Larsson RN, MSN, [facility] Mobile: [phone]

## 2021-05-21 NOTE — Patient Instructions (Signed)
Visit Information  Thank you for taking time to visit with me today. Please don't hesitate to contact me if I can be of assistance to you before our next scheduled telephone appointment.  Following are the goals we discussed today:  RNCM Clinical Goal(s):  Patient will verbalize understanding of plan for management of HTN, HLD, and DMII work with RN Case Manager to address needs related to HTN, HLD, and DMII and Level of care concerns take all medications exactly as prescribed and will call provider for medication related questions attend all scheduled medical appointments: 10-11-2021 demonstrate a decrease in HTN, HLD, and DMII exacerbations demonstrate improved adherence to prescribed treatment plan for HTN, HLD, and DMII demonstrate improved health management independence verbalize basic understanding of HTN, HLD, and DMII disease process and self health management plan demonstrate understanding of rationale for each prescribed medication demonstrate ongoing self health care management ability through collaboration with RN Care manager, provider, and care team.    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 Patient had been off of medications x 3 months but is consistently taking. 05-21-2021: The patient is compliant with medications. His goal is to effectively  manage his conditions and hopefully come off of some of the medications ; Counseled on importance of regular laboratory monitoring as prescribed, discussed hemoglobin A1C goal and likelihood of testing at next appointment with pcp. The patient is hopeful that his A1C is going to be less than 9.3. 03-01-2021: Newest A1C level is 6.6 on 02-08-2021. 05-21-2021: Newest A1C 6.4 % on 05-17-2021. Praised the patient for hard work on getting A1C levels down and monitoring blood sugars.  The patient states that he has lost about 20 pounds and he is also eating better. The patient has made positive changes in his dietary habits.  Discussed plans with patient for ongoing care management follow up and provided patient with direct contact information for care management team; Provided patient with written educational materials related to hypo and hyperglycemia and importance of correct treatment- will send information through [facility] and My chart; Reviewed scheduled/upcoming provider appointments including: 10-11-2021 Advised patient, providing education and rationale, to check cbg bid and record, calling pcp for findings outside established parameters, the patient is checking his blood sugars on the weekends. 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  Encouraged the patient to check as much as possible when he is out on the road. Patient reports fasting is usually around 115 and post prandial numbers are 165-180. Review of goals of fasting <130 and post prandial of <180, also discussed 15 minutes of moderate exercise could help the patient with lowering blood sugars. The patient advised it is hard to have an exercise routine on the road. 03-01-2021: The patient checks his blood sugars when he is home. He states fasting it is 115 in the mornings and usually always <180 after  eating. States he eats a lot of chicken. Does like to eat bacon. Discussed healthier options and only  eating bacon once in a while. 03-05-2021: The patient states when he was having bad headache after taking atorvastatin his blood sugar was 112.  05-21-2021: The patient is doing well and has made positive changes to improve his health and well being. The patient states that he has lost 20 pounds and is also eating better. He has cut out sodas and mainly drinks water. The patient states that if he does have a soda he drinks sprite zero. Praised the patient for accomplishments. Encouraged the patient to continue on positive lifestyle changes. Will continue to monitor.  Review of patient status, including review of consultants reports, relevant laboratory and other test results, and medications completed; Screening for signs and symptoms of depression related to chronic disease state;  Assessed social determinant of health barriers;    Hyperlipidemia:  (Status: Goal on track: NO.)      Lab Results  Component Value Date    CHOL 179 05/17/2021    HDL 44 05/17/2021    LDLCALC 106 (H) 05/17/2021    TRIG 165 (H) 05/17/2021    CHOLHDL 4 11/21/2019    Medication review performed; medication list updated in electronic medical record.  Provider established cholesterol goals reviewed, 03-01-2021: Reviewed the labwork and notes from the pcp on 02-08-2021. The patients LDL is 106 and goal is <70.  The patient does not want to take another pill but states if his pcp feels like this is something he needs to do he will do it. Recommended dietary changes as well to help with lowering of LDL.  Education and support given. The patient agrees to start the atorvastatin 10 mg and ask that it be sent in to [place] and he will pick up with his other refills. In basket message sent to pcp with request after review of the pcp notes with the patient. Advised the patient to call with any questions or concerns.  03-05-2021: The patient called the RNCM and states he cannot take the new medication for cholesterol. He took it for 2  days and states he had throbbing pain behind his eyes and forehead both days  he took the medication. He took it for 2 days and it did this both days. The patient has stopped taking the medication and states he cannot function like this. The patient states he will work on his diet and try to get levels down by dietary measures. The patient states he had to go to bed for 3 or 4 hours just to function again. Blood pressures was 135/80 and all other vital signs were in normal limits. The patient talked about fixing his bacon in an air fryer and eating quail eggs. Will notify the pcp of changes and the patient stopping the medication. 05-21-2021: The patient has a new script for Crestor (Rosuvastatin 10 mg) to take 3 times a week for his cholesterol. The patient has not picked it up yet but will when he gets in tomorrow. The patient states that he hopes he doesn't have issues like he did with the other medication. Education and support given. The patient knows to call the office for changes in his condition or problems with medications Counseled on importance of regular laboratory monitoring as prescribed; Provided HLD educational materials; Reviewed role and benefits of statin for ASCVD risk reduction; Discussed strategies to manage statin-induced myalgias; Reviewed importance of limiting foods high in cholesterol.  03-05-2021: Reviewed and gave suggestions for foods that support heart health and lower cholesterol. 05-21-2021: Reviewed foods that the patient has been eating. Praised for positive changes in food choices. ;  Screening for signs and symptoms of depression related to chronic disease state;  Assessed social determinant of health barriers;    Hypertension: (Status: Goal on track: YES.) Last practice recorded BP readings:     BP Readings from Last 3 Encounters:  05/17/21 114/75  02/08/21 128/74  12/07/20 122/84  Reading when having issues with atorvastatin was 135/80 Most recent eGFR/CrCl: No  results found for: EGFR  No components found for: CRCL   Evaluation of current treatment plan related to hypertension self management and patient's adherence to plan as established by provider. 05-21-2021: The patient has more stable blood pressures at this time. Denies any acute needs. States that he is taking his medications as prescribed. No new concerns with HTN management;   Provided education to patient re: stroke prevention, s/s of heart attack and stroke; Reviewed medications with patient and discussed importance of compliance;  Discussed plans with patient for ongoing care management follow up and provided patient with direct contact information for care management team; Advised patient, providing education and rationale, to monitor blood pressure daily and record, calling PCP for findings outside established parameters;  Reviewed scheduled/upcoming provider appointments including, 10-11-2021 for follow up with the pcp   Provided education on prescribed diet heart healthy/ADA. 05-21-2021: Review with the patient- recommendations provided. Praised the patient for positive changes in dietary habits;  Discussed complications of poorly controlled blood pressure such as heart disease, stroke, circulatory complications, vision complications, kidney impairment, sexual dysfunction;    Patient Goals/Self-Care Activities: Patient will self administer medications as prescribed Patient will attend all scheduled provider appointments Patient will call pharmacy for medication refills Patient will attend church or other social activities Patient will continue to perform ADL's independently Patient will continue to perform IADL's independently Patient will call provider office for new concerns or questions Patient will work with BSW to address care coordination needs and will continue to work with the clinical team to address health care and disease management related needs.        Our next  appointment is by telephone on 07-23-2021  at 0900 am  Please call the care guide team at [phone] if you need to cancel or reschedule your appointment.   Please call 911 call the Suicide and Crisis Lifeline: 988 call the [place] National Suicide Prevention Lifeline: 680 624 1628 call 1-800-273-TALK (toll free, 24 hour hotline) if you are experiencing a Mental Health or [place] or need someone to talk to.  Patient verbalizes understanding of instructions provided today and agrees to view in [place].   Telephone follow up appointment with care management team member scheduled for: 07-23-2021 at 0900 am  Noreene Larsson RN, MSN, [facility] Mobile: [phone]

## 2021-05-24 NOTE — Progress Notes (Signed)
Please let patient know:  Good morning Danen, your labs have returned: - Kidney function, creatinine and eGFR, remains stable. - Cholesterol levels are above goal, as we discussed I have sent in Rosuvastatin for you to start taking 10 MG three days a week. If any side effects with this, let me know. Any questions? Keep being stellar!! Thank you for allowing me to participate in your care. I appreciate you. Kindest regards, Meridith Romick

## 2021-05-31 ENCOUNTER — Ambulatory Visit: Payer: 59 | Admitting: Podiatry

## 2021-06-07 ENCOUNTER — Ambulatory Visit (INDEPENDENT_AMBULATORY_CARE_PROVIDER_SITE_OTHER): Payer: 59 | Admitting: Podiatry

## 2021-06-07 ENCOUNTER — Other Ambulatory Visit: Payer: Self-pay

## 2021-06-07 DIAGNOSIS — B351 Tinea unguium: Secondary | ICD-10-CM

## 2021-06-07 DIAGNOSIS — E0843 Diabetes mellitus due to underlying condition with diabetic autonomic (poly)neuropathy: Secondary | ICD-10-CM

## 2021-06-07 DIAGNOSIS — M79675 Pain in left toe(s): Secondary | ICD-10-CM

## 2021-06-07 DIAGNOSIS — M79674 Pain in right toe(s): Secondary | ICD-10-CM | POA: Diagnosis not present

## 2021-06-10 NOTE — Progress Notes (Signed)
   SUBJECTIVE Patient with a history of diabetes mellitus presents to office today complaining of elongated, thickened nails that cause pain while ambulating in shoes.  Patient is unable to trim their own nails. Patient is here for further evaluation and treatment.   Past Medical History:  Diagnosis Date   Diabetes mellitus without complication (HCC)    Hematuria    onset 29 years ago- cause unkown to the patient   Hypertension     OBJECTIVE General Patient is awake, alert, and oriented x 3 and in no acute distress. Derm Skin is dry and supple bilateral. Negative open lesions or macerations. Remaining integument unremarkable. Nails are tender, long, thickened and dystrophic with subungual debris, consistent with onychomycosis, 1-5 bilateral. No signs of infection noted. Vasc  DP and PT pedal pulses palpable bilaterally. Temperature gradient within normal limits.  Neuro Epicritic and protective threshold sensation diminished bilaterally.  Musculoskeletal Exam No symptomatic pedal deformities noted bilateral. Muscular strength within normal limits.  ASSESSMENT 1. Diabetes Mellitus w/ peripheral neuropathy 2.  Pain due to onychomycosis of toenails bilateral  PLAN OF CARE 1. Patient evaluated today. 2. Instructed to maintain good pedal hygiene and foot care. Stressed importance of controlling blood sugar.  3. Mechanical debridement of nails 1-5 bilaterally performed using a nail nipper. Filed with dremel without incident.  4. Return to clinic in 3 mos.     Felecia Shelling, DPM [facility]  Dr. Felecia Shelling, DPM    [address] [place], [place] [address]                Office [phone]  Fax [phone]

## 2021-07-23 ENCOUNTER — Ambulatory Visit: Payer: Self-pay

## 2021-07-23 ENCOUNTER — Telehealth: Payer: 59

## 2021-07-23 DIAGNOSIS — I152 Hypertension secondary to endocrine disorders: Secondary | ICD-10-CM

## 2021-07-23 DIAGNOSIS — E1169 Type 2 diabetes mellitus with other specified complication: Secondary | ICD-10-CM

## 2021-07-23 DIAGNOSIS — E1129 Type 2 diabetes mellitus with other diabetic kidney complication: Secondary | ICD-10-CM

## 2021-07-23 NOTE — Chronic Care Management (AMB) (Signed)
Care Management    RN Visit Note  07/23/2021 Name: Joseph Burch MRN: [id number] DOB: Aug 01, 1970  Subjective: Joseph Burch is a 51 y.o. year old male who is a primary care patient of Cannady, Barbaraann Faster, NP. The care management team was consulted for assistance with disease management and care coordination needs.    Engaged with patient by telephone for follow up visit in response to provider referral for case management and/or care coordination services.   Consent to Services:   [name] was given information about Care Management services today including:  Care Management services includes personalized support from designated clinical staff supervised by his physician, including individualized plan of care and coordination with other care providers 24/7 contact phone numbers for assistance for urgent and routine care needs. The patient may stop case management services at any time by phone call to the office staff.  Patient agreed to services and consent obtained.   Assessment: Review of patient past medical history, allergies, medications, health status, including review of consultants reports, laboratory and other test data, was performed as part of comprehensive evaluation and provision of chronic care management services.   SDOH (Social Determinants of Health) assessments and interventions performed:    Care Plan  Allergies  Allergen Reactions   Atorvastatin Other (See Comments)    Headache side effect   Guaifenesin Itching   Codeine Rash   Other Rash    Outpatient Encounter Medications as of 07/23/2021  Medication Sig   amLODipine (NORVASC) 10 MG tablet Take 1 tablet (10 mg total) by mouth daily.   aspirin EC 81 MG tablet Take 81 mg by mouth daily. Swallow whole. (Patient not taking: Reported on 05/17/2021)   [employer] Lancets Thin 26G MISC To check blood sugar 2-3 times daily.   ertugliflozin L-PyroglutamicAc (STEGLATRO) 5 MG TABS tablet Take 1 tablet (5 mg total) by mouth  daily.   glucose blood ([employer] GLUCOSE METER TEST STRIPS) test strip To check blood sugar 2-3 times daily.   metFORMIN (GLUCOPHAGE XR) 500 MG 24 hr tablet Take 2 tablets (1,000 mg total) by mouth 2 (two) times daily.   rosuvastatin (CRESTOR) 10 MG tablet Take one tablet (10 MG) by mouth three days a week; Monday, Wednesday, Friday.   No facility-administered encounter medications on file as of 07/23/2021.    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    Conditions to be addressed/monitored: HTN, HLD, and DMII  Care Plan : RNCM: Management of Chronic Conditions: HTN, HLD, DM2  Updates made by Vanita Ingles, RN since 07/23/2021 12:00 AM     Problem: RNCM: Chronic disese management care plan for HTN, HLD, DM2   Priority: High     Long-Range Goal: RNCM: Management of HTN, HLD, DM2   Start Date: 01/16/2021  Expected End Date: 01/16/2022  Recent Progress: On track  Priority: High  Note:   Current Barriers:  Knowledge Deficits related to plan of care for management of HTN, HLD, and DMII  Care Coordination needs related to Level of care concerns in a patient with HTN, HLD, and DMII Chronic Disease Management support and education needs related to HTN, HLD,  and DMII Non-adherence to prescribed medication regimen  RNCM Clinical Goal(s):  Patient will verbalize understanding of plan for management of HTN, HLD, and DMII work with RN Case Manager to address needs related to HTN, HLD, and DMII and Level of care concerns take all medications exactly as prescribed and will call provider for medication related questions attend all  scheduled medical appointments: 10-11-2021 demonstrate a decrease in HTN, HLD, and DMII exacerbations demonstrate improved adherence to prescribed treatment plan for HTN, HLD, and DMII demonstrate improved health management independence verbalize basic understanding of HTN, HLD, and DMII disease process and self health management plan demonstrate understanding of rationale for each prescribed medication demonstrate ongoing self health care management ability through collaboration with RN Care manager, provider, and care team.   Interventions: 1:1 collaboration with Marnee Guarneri, NP, regarding development and update of comprehensive plan of care as evidenced by provider attestation and co-signature Inter-disciplinary care team collaboration (see longitudinal plan of care)   Diabetes:  (Status: Goal on track: YES.) Lab Results  Component Value Date   HGBA1C 6.4 (H) 05/17/2021  *A1C on DOT physical on 11-02-2020 was 9.3- on 02-08-2021 new value 6.6% Assessed patient's understanding of A1c goal: <7% Provided education to patient about basic DM disease process; Reviewed medications with patient and discussed importance of medication adherence, patient states compliance with medications. Patient had been off of medications x 3 months but is consistently taking. 05-21-2021: The patient is compliant with medications. His goal is to effectively manage his conditions and hopefully come off of some of the medications. 07-23-2021: The patient is compliant with medications and denies any acute findings with medications management for DM ; Counseled on importance of regular laboratory monitoring as prescribed, discussed hemoglobin A1C goal and likelihood of testing at next appointment with pcp. The patient is hopeful that his A1C is going to be less than 9.3. 03-01-2021: Newest A1C level is 6.6 on 02-08-2021. 05-21-2021: Newest A1C 6.4 % on 05-17-2021. Praised the patient for hard work on getting A1C levels down  and monitoring blood sugars.  The patient states that he has lost about 20 pounds and he is also eating better. The patient has made positive changes in his dietary habits. 07-23-2021: Is having regular lab testing for levels. Is compliant and continues to make positive changes in lifestyle to be healthy and maintain health and well being.  Discussed plans with patient for ongoing care management follow up and provided patient with direct contact information for care management team; Provided patient with written educational materials related to hypo and hyperglycemia and importance of correct treatment- will send information through [facility] and My chart. 07-23-2021: Denies any issues with hypo and hyperglycemia today. Is monitoring for changes; Reviewed scheduled/upcoming provider appointments including: 10-11-2021 Podiatry check on 06-07-2021 with trimming of nails and evaluation of feet Advised patient, providing education and rationale, to check cbg bid and record, calling pcp for findings outside established parameters, the patient is checking his blood sugars on the weekends. 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  Encouraged the patient to check as much as possible when he is out on the road. Patient reports fasting is usually around 115 and post prandial numbers are 165-180. Review of goals of fasting <130 and post prandial of <180, also discussed 15 minutes of moderate exercise could help the patient with lowering blood sugars. The patient advised it is hard to have an exercise routine on  the road. 03-01-2021: The patient checks his blood sugars when he is home. He states fasting it is 115 in the mornings and usually always <180 after eating. States he eats a lot of chicken. Does like to eat bacon. Discussed healthier options and only eating bacon once in a while. 03-05-2021: The patient states when he was having bad headache after taking atorvastatin his  blood sugar was 112.  05-21-2021: The patient is doing well and has made positive changes to improve his health and well being. The patient states that he has lost 20 pounds and is also eating better. He has cut out sodas and mainly drinks water. The patient states that if he does have a soda he drinks sprite zero. Praised the patient for accomplishments. Encouraged the patient to continue on positive lifestyle changes. Will continue to monitor. 07-23-2021: The patient states his range for blood sugars is around 114. The patient states that on the road he is even cut out breads, pasta, and starches that make his blood sugars go up. He continues to eat healthy at home as well as his wife eats a keto diet and he is eating the same things she is at home.  Review of patient status, including review of consultants reports, relevant laboratory and other test results, and medications completed. 07-23-2021: The patient was asking about DOT physical if it could be changed to every 2 years since he was seeing the provider regularly. Advised the patient to discuss with the pcp at next appointment; Screening for signs and symptoms of depression related to chronic disease stat. 07-23-2021: No issues related to changes in mood, anxiety, or depression;  Assessed social determinant of health barriers;   Hyperlipidemia:  (Status: Goal on Track (progressing): YES.) Lab Results  Component Value Date   CHOL 179 05/17/2021   HDL 44 05/17/2021   LDLCALC 106 (H) 05/17/2021   TRIG 165 (H) 05/17/2021   CHOLHDL 4 11/21/2019    Medication review performed; medication list updated in electronic medical record. 07-23-2021: The patient endorses taking Crestor 10 mg three times a week. He is compliant and is hopeful this will bring his cholesterol level in range.  Provider established cholesterol goals reviewed, 03-01-2021: Reviewed the labwork and notes from the pcp on 02-08-2021. The patients LDL is 106 and goal is <70.  The patient  does not want to take another pill but states if his pcp feels like this is something he needs to do he will do it. Recommended dietary changes as well to help with lowering of LDL.  Education and support given. The patient agrees to start the atorvastatin 10 mg and ask that it be sent in to [facility] and he will pick up with his other refills. In basket message sent to pcp with request after review of the pcp notes with the patient. Advised the patient to call with any questions or concerns.  03-05-2021: The patient called the RNCM and states he cannot take the new medication for cholesterol. He took it for 2 days and states he had throbbing pain behind his eyes and forehead both days  he took the medication. He took it for 2 days and it did this both days. The patient has stopped taking the medication and states he cannot function like this. The patient states he will work on his diet and try to get levels down by dietary measures. The patient states he had to go to bed for 3 or 4 hours just to function  again. Blood pressures was 135/80 and all other vital signs were in normal limits. The patient talked about fixing his bacon in an air fryer and eating quail eggs. Will notify the pcp of changes and the patient stopping the medication. 05-21-2021: The patient has a new script for Crestor (Rosuvastatin 10 mg) to take 3 times a week for his cholesterol. The patient has not picked it up yet but will when he gets in tomorrow. The patient states that he hopes he doesn't have issues like he did with the other medication. Education and support given. The patient knows to call the office for changes in his condition or problems with medications. 07-23-2021: The patient is taking Crestor as prescribed and the patient is also changing eating habits and eating healthier. The patient denies any acute changes in HLD health and feels with medications and changes his cholesterol levels with me better when checked again.   Counseled on importance of regular laboratory monitoring as prescribed; Provided HLD educational materials; Reviewed role and benefits of statin for ASCVD risk reduction; Discussed strategies to manage statin-induced myalgias; Reviewed importance of limiting foods high in cholesterol. 03-05-2021: Reviewed and gave suggestions for foods that support heart health and lower cholesterol. 07-23-2021: Reviewed foods that the patient has been eating. Praised for positive changes in food choices. ;  Screening for signs and symptoms of depression related to chronic disease state;  Assessed social determinant of health barriers;   Hypertension: (Status: Goal on track: YES.) Last practice recorded BP readings:  BP Readings from Last 3 Encounters:  05/17/21 114/75  02/08/21 128/74  12/07/20 122/84  Reading when having issues with atorvastatin was 135/80 Most recent eGFR/CrCl: No results found for: EGFR  No components found for: CRCL  Evaluation of current treatment plan related to hypertension self management and patient's adherence to plan as established by provider. 07-23-2021: The patient has more stable blood pressures at this time. Denies any acute needs. States that he is taking his medications as prescribed. No new concerns with HTN management or heart health..   Provided education to patient re: stroke prevention, s/s of heart attack and stroke; Reviewed medications with patient and discussed importance of compliance. 07-23-2021: Is compliant with medications  Discussed plans with patient for ongoing care management follow up and provided patient with direct contact information for care management team; Advised patient, providing education and rationale, to monitor blood pressure daily and record, calling PCP for findings outside established parameters;  Reviewed scheduled/upcoming provider appointments including, 10-11-2021 for follow up with the pcp   Provided education on prescribed diet heart  healthy/ADA. 07-23-2021 Review with the patient- recommendations provided. Praised the patient for positive changes in dietary habits. He is eating better when he is out on the road and he is also eating better at home and following a keto diet with his wife when he is at home and not on the road  Discussed complications of poorly controlled blood pressure such as heart disease, stroke, circulatory complications, vision complications, kidney impairment, sexual dysfunction;   Patient Goals/Self-Care Activities: Patient will self administer medications as prescribed Patient will attend all scheduled provider appointments Patient will call pharmacy for medication refills Patient will attend church or other social activities Patient will continue to perform ADL's independently Patient will continue to perform IADL's independently Patient will call provider office for new concerns or questions Patient will work with BSW to address care coordination needs and will continue to work with the clinical team to address health  care and disease management related needs.        Plan: Telephone follow up appointment with care management team member scheduled for:  09-25-2021 at 0900 am  Noreene Larsson RN, MSN, [facility] Mobile: [phone]

## 2021-07-23 NOTE — Patient Instructions (Signed)
Visit Information  Thank you for taking time to visit with me today. Please don't hesitate to contact me if I can be of assistance to you before our next scheduled telephone appointment.  Following are the goals we discussed today:  RNCM Clinical Goal(s):  Patient will verbalize understanding of plan for management of HTN, HLD, and DMII work with RN Case Manager to address needs related to HTN, HLD, and DMII and Level of care concerns take all medications exactly as prescribed and will call provider for medication related questions attend all scheduled medical appointments: 10-11-2021 demonstrate a decrease in HTN, HLD, and DMII exacerbations demonstrate improved adherence to prescribed treatment plan for HTN, HLD, and DMII demonstrate improved health management independence verbalize basic understanding of HTN, HLD, and DMII disease process and self health management plan demonstrate understanding of rationale for each prescribed medication demonstrate ongoing self health care management ability through collaboration with RN Care manager, provider, and care team.    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 Patient had been off of medications x 3 months but is consistently taking. 05-21-2021: The patient is compliant with medications. His goal is to effectively  manage his conditions and hopefully come off of some of the medications. 07-23-2021: The patient is compliant with medications and denies any acute findings with medications management for DM ; Counseled on importance of regular laboratory monitoring as prescribed, discussed hemoglobin A1C goal and likelihood of testing at next appointment with pcp. The patient is hopeful that his A1C is going to be less than 9.3. 03-01-2021: Newest A1C level is 6.6 on 02-08-2021. 05-21-2021: Newest A1C 6.4 % on 05-17-2021. Praised the patient for hard work on getting A1C levels down and monitoring blood sugars.  The patient states that he has lost about 20 pounds and he is also eating better. The patient has made positive changes in his dietary habits. 07-23-2021: Is having regular lab testing for levels. Is compliant and continues to make positive changes in lifestyle to be healthy and maintain health and well being.  Discussed plans with patient for ongoing care management follow up and provided patient with direct contact information for care management team; Provided patient with written educational materials related to hypo and hyperglycemia and importance of correct treatment- will send information through [facility] and My chart. 07-23-2021: Denies any issues with hypo and hyperglycemia today. Is monitoring for changes; Reviewed scheduled/upcoming provider appointments including: 10-11-2021 Podiatry check on 06-07-2021 with trimming of nails and evaluation of feet Advised patient, providing education and rationale, to check cbg bid and record, calling pcp for findings outside established parameters, the patient is checking his blood sugars on the weekends. 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  Encouraged the patient to check as much as possible when he is out on the road. Patient reports fasting is usually around 115 and post  prandial numbers are 165-180. Review of goals of  fasting <130 and post prandial of <180, also discussed 15 minutes of moderate exercise could help the patient with lowering blood sugars. The patient advised it is hard to have an exercise routine on the road. 03-01-2021: The patient checks his blood sugars when he is home. He states fasting it is 115 in the mornings and usually always <180 after eating. States he eats a lot of chicken. Does like to eat bacon. Discussed healthier options and only eating bacon once in a while. 03-05-2021: The patient states when he was having bad headache after taking atorvastatin his blood sugar was 112.  05-21-2021: The patient is doing well and has made positive changes to improve his health and well being. The patient states that he has lost 20 pounds and is also eating better. He has cut out sodas and mainly drinks water. The patient states that if he does have a soda he drinks sprite zero. Praised the patient for accomplishments. Encouraged the patient to continue on positive lifestyle changes. Will continue to monitor. 07-23-2021: The patient states his range for blood sugars is around 114. The patient states that on the road he is even cut out breads, pasta, and starches that make his blood sugars go up. He continues to eat healthy at home as well as his wife eats a keto diet and he is eating the same things she is at home.  Review of patient status, including review of consultants reports, relevant laboratory and other test results, and medications completed. 07-23-2021: The patient was asking about DOT physical if it could be changed to every 2 years since he was seeing the provider regularly. Advised the patient to discuss with the pcp at next appointment; Screening for signs and symptoms of depression related to chronic disease stat. 07-23-2021: No issues related to changes in mood, anxiety, or depression;  Assessed social determinant of health barriers;    Hyperlipidemia:  (Status: Goal on Track (progressing): YES.)       Lab Results  Component Value Date    CHOL 179 05/17/2021    HDL 44 05/17/2021    LDLCALC 106 (H) 05/17/2021    TRIG 165 (H) 05/17/2021    CHOLHDL 4 11/21/2019    Medication review performed; medication list updated in electronic medical record. 07-23-2021: The patient endorses taking Crestor 10 mg three times a week. He is compliant and is hopeful this will bring his cholesterol level in range.  Provider established cholesterol goals reviewed, 03-01-2021: Reviewed the labwork and notes from the pcp on 02-08-2021. The patients LDL is 106 and goal is <70.  The patient does not want to take another pill but states if his pcp feels like this is something he needs to do he will do it. Recommended dietary changes as well to help with lowering of LDL.  Education and support given. The patient agrees to start the atorvastatin 10 mg and ask that it be sent in to [facility] and he will pick up with his other refills. In basket message sent to pcp with request after review of the pcp notes with the patient. Advised the patient to call with any questions or concerns.  03-05-2021: The patient called the RNCM and states he cannot take the new medication for cholesterol. He took it for 2 days and states he had throbbing pain behind his eyes and forehead both days  he took the medication. He took it for 2 days and it  did this both days. The patient has stopped taking the medication and states he cannot function like this. The patient states he will work on his diet and try to get levels down by dietary measures. The patient states he had to go to bed for 3 or 4 hours just to function again. Blood pressures was 135/80 and all other vital signs were in normal limits. The patient talked about fixing his bacon in an air fryer and eating quail eggs. Will notify the pcp of changes and the patient stopping the medication. 05-21-2021: The patient has a new script for Crestor (Rosuvastatin 10 mg) to take 3 times a week for his  cholesterol. The patient has not picked it up yet but will when he gets in tomorrow. The patient states that he hopes he doesn't have issues like he did with the other medication. Education and support given. The patient knows to call the office for changes in his condition or problems with medications. 07-23-2021: The patient is taking Crestor as prescribed and the patient is also changing eating habits and eating healthier. The patient denies any acute changes in HLD health and feels with medications and changes his cholesterol levels with me better when checked again.  Counseled on importance of regular laboratory monitoring as prescribed; Provided HLD educational materials; Reviewed role and benefits of statin for ASCVD risk reduction; Discussed strategies to manage statin-induced myalgias; Reviewed importance of limiting foods high in cholesterol. 03-05-2021: Reviewed and gave suggestions for foods that support heart health and lower cholesterol. 07-23-2021: Reviewed foods that the patient has been eating. Praised for positive changes in food choices. ;  Screening for signs and symptoms of depression related to chronic disease state;  Assessed social determinant of health barriers;    Hypertension: (Status: Goal on track: YES.) Last practice recorded BP readings:     BP Readings from Last 3 Encounters:  05/17/21 114/75  02/08/21 128/74  12/07/20 122/84  Reading when having issues with atorvastatin was 135/80 Most recent eGFR/CrCl: No results found for: EGFR  No components found for: CRCL   Evaluation of current treatment plan related to hypertension self management and patient's adherence to plan as established by provider. 07-23-2021: The patient has more stable blood pressures at this time. Denies any acute needs. States that he is taking his medications as prescribed. No new concerns with HTN management or heart health..   Provided education to patient re: stroke prevention, s/s of heart  attack and stroke; Reviewed medications with patient and discussed importance of compliance. 07-23-2021: Is compliant with medications  Discussed plans with patient for ongoing care management follow up and provided patient with direct contact information for care management team; Advised patient, providing education and rationale, to monitor blood pressure daily and record, calling PCP for findings outside established parameters;  Reviewed scheduled/upcoming provider appointments including, 10-11-2021 for follow up with the pcp   Provided education on prescribed diet heart healthy/ADA. 07-23-2021 Review with the patient- recommendations provided. Praised the patient for positive changes in dietary habits. He is eating better when he is out on the road and he is also eating better at home and following a keto diet with his wife when he is at home and not on the road  Discussed complications of poorly controlled blood pressure such as heart disease, stroke, circulatory complications, vision complications, kidney impairment, sexual dysfunction;    Patient Goals/Self-Care Activities: Patient will self administer medications as prescribed Patient will attend all scheduled provider appointments Patient will  call pharmacy for medication refills Patient will attend church or other social activities Patient will continue to perform ADL's independently Patient will continue to perform IADL's independently Patient will call provider office for new concerns or questions Patient will work with BSW to address care coordination needs and will continue to work with the clinical team to address health care and disease management related needs.        Our next appointment is by telephone on 09-25-2021 at 0900 am   Please call the care guide team at [phone] if you need to cancel or reschedule your appointment.   If you are experiencing a Mental Health or [place] or need someone to talk to,  please call the Suicide and Crisis Lifeline: 988 call the [place] National Suicide Prevention Lifeline: 606-556-3383 or TTY: (703) 125-7192 TTY 747-262-3696) to talk to a trained counselor call 1-800-273-TALK (toll free, 24 hour hotline)   Patient verbalizes understanding of instructions and care plan provided today and agrees to view in [facility]. Active MyChart status confirmed with patient.    Telephone follow up appointment with care management team member scheduled for:  09-25-2021 at 0900 am  Noreene Larsson RN, MSN, [facility] Mobile: [phone]

## 2021-09-10 ENCOUNTER — Ambulatory Visit: Payer: Self-pay | Admitting: *Deleted

## 2021-09-10 NOTE — Telephone Encounter (Signed)
PT will come in on Friday @ 4:20 ?

## 2021-09-10 NOTE — Telephone Encounter (Signed)
?  Chief Complaint: Eye "Blood spots" ?Symptoms: sclera with broken vessels, right eye ?Frequency: Saturday Has happened before with both eyes since starting rosuvastatin ?Pertinent Negatives: Patient denies pain, itching, discharge, visual changes ?Disposition: [] ED /[] Urgent Care (no appt availability in office) / [x] Appointment(In office/virtual)/ []  [facility]/ [] Home Care/ [] Refused Recommended Disposition /[]  Mobile Bus/ []  Follow-up with PCP ?Additional Notes: Requests Jol[facility] only. Needs Friday appt only. Unable to secure within protocol time frame. "I can send a picture." Has MyChart. Please advise ?Reason for Disposition ? [1] Only 1 eye is red AND [2] present > 48 hours ? ?Answer Assessment - Initial Assessment Questions ?1. LOCATION: Location: "What's red, the eyeball or the outer eyelids?" (Note: when callers say the eye is red, they usually mean the sclera is red)   ?    sclera ?2. REDNESS OF SCLERA: "Is the redness in one or both eyes?" "When did the redness start?"  ?    Blood spots ?3. ONSET: "When did the eye become red?" (e.g., hours, days)  ?    Saturday ?4. EYELIDS: "Are the eyelids red or swollen?" If Yes, ask: "How much?"  ?    no ?5. VISION: "Is there any difficulty seeing clearly?"  ?    no ?6. ITCHING: "Does it feel itchy?" If so ask: "How bad is it" (e.g., Scale 1-10; or mild, moderate, severe) ?    no ?7. PAIN: "Is there any pain? If Yes, ask: "How bad is it?" (e.g., Scale 1-10; or mild, moderate, severe) ?    no ?8. CONTACT LENS: "Do you wear contacts?" ?    no ?9. CAUSE: "What do you think is causing the redness?" ?    Maybe new med ?10. OTHER SYMPTOMS: "Do you have any other symptoms?" (e.g., fever, runny nose, cough, vomiting) ?      no ? ?Protocols used: Eye - Red Without Pus-A-AH ? ?

## 2021-09-10 NOTE — Telephone Encounter (Signed)
PT states he is out of town right now and fridays are the only days he could come for appt. Advised pt of next available Friday appt which is March 31st and pt said he would just take a picture of it and show it to you at his next visit in April. He does not want to see any other provider. ?

## 2021-09-13 ENCOUNTER — Encounter: Payer: Self-pay | Admitting: Nurse Practitioner

## 2021-09-13 ENCOUNTER — Ambulatory Visit (INDEPENDENT_AMBULATORY_CARE_PROVIDER_SITE_OTHER): Payer: 59 | Admitting: Nurse Practitioner

## 2021-09-13 ENCOUNTER — Other Ambulatory Visit: Payer: Self-pay

## 2021-09-13 DIAGNOSIS — H1131 Conjunctival hemorrhage, right eye: Secondary | ICD-10-CM | POA: Diagnosis not present

## 2021-09-13 DIAGNOSIS — H1130 Conjunctival hemorrhage, unspecified eye: Secondary | ICD-10-CM | POA: Insufficient documentation

## 2021-09-13 NOTE — Patient Instructions (Signed)
Subconjunctival Hemorrhage ?Subconjunctival hemorrhage is bleeding that happens between the white part of your eye (sclera) and the clear membrane that covers the outside of your eye (conjunctiva). There are many tiny blood vessels near the surface of your eye. A subconjunctival hemorrhage happens when one or more of these vessels breaks and bleeds, causing a red patch to appear on your eye. This is similar to a bruise. ?Depending on the amount of bleeding, the red patch may only cover a small area of your eye or it may cover the entire visible part of the sclera. If a lot of blood collects under the conjunctiva, there may also be swelling. Subconjunctival hemorrhages do not affect your vision or cause pain, but your eye may feel irritated if there is swelling. Subconjunctival hemorrhages usually do not require treatment, and they usually disappear on their own within two to four weeks. ?What are the causes? ?This condition may be caused by: ?Mild trauma, such as rubbing your eye too hard. ?Blunt injuries, such as from playing sports or coming into contact with a deployed airbag. ?Coughing, sneezing, or vomiting. ?Straining, such as when lifting a heavy object. ?Medical conditions, such as: ?High blood pressure. ?Diabetes. ?Recent eye surgery. ?Certain medicines, especially blood thinners (anticoagulants), including aspirin. ?Other conditions, such as eye tumors, bleeding disorders, or blood vessel abnormalities. ?Subconjunctival hemorrhages can also happen without an obvious cause. ?What are the signs or symptoms? ?Symptoms of this condition include: ?A bright red or dark red patch on the white part of the eye. The red area may: ?Spread out to cover a larger area of the eye before it goes away. ?Turn colors such as pink or brownish-yellow before it goes away. ?Swelling around the eye. ?Mild eye irritation. ?How is this diagnosed? ?This condition is diagnosed with a physical exam. If your subconjunctival hemorrhage  was caused by trauma, your health care provider may refer you to an eye specialist (ophthalmologist) or another specialist to check for other injuries. You may have other tests, including: ?An eye exam including a vision test, checking your eye with a type of microscope (slit lamp) and measuring the pressure in your eye. Your eye may be dilated, especially if your subconjunctival hemorrhage was caused by trauma. ?A blood pressure check. ?Blood tests to check for bleeding disorders. ?If your subconjunctival hemorrhage was caused by trauma, X-rays or a CT scan may be done to check for other injuries. ?How is this treated? ?Usually, treatment is not needed for this condition. If you have discomfort, your health care provider may recommend eye drops or cold compresses. ?Follow these instructions at home: ?Take over-the-counter and prescription medicines only as directed by your health care provider. ?Use eye drops or cold compresses to help with discomfort as directed by your health care provider. ?Avoid activities, things, and environments that may irritate or injure your eye. ?Keep all follow-up visits. This is important. ?Contact a health care provider if: ?You have pain in your eye. ?The bleeding does not go away within 4 weeks. ?You keep getting new subconjunctival hemorrhages. ?Get help right away if: ?Your vision changes, you have difficulty seeing, or you develop double vision. ?You suddenly develop severe sensitivity to light. ?You develop a severe headache, persistent vomiting, confusion, or abnormal tiredness (lethargy). ?Your eye seems to bulge or protrude from your eye socket. ?You develop unexplained bruises on your body. ?You have unexplained bleeding in another area of your body. ?These symptoms may represent a serious problem that is an emergency. Do not   wait to see if the symptoms will go away. Get medical help right away. Call your local emergency services (911 in the [place] Do not drive yourself to  the hospital. ?Summary ?Subconjunctival hemorrhage is bleeding that happens between the white part of your eye and the clear membrane that covers the outside of your eye. ?This condition is similar to a bruise. ?Subconjunctival hemorrhages usually do not require treatment, and they usually disappear on their own within two to four weeks. ?Use eye drops or cold compresses to help with discomfort as directed by your health care provider. ?This information is not intended to replace advice given to you by your health care provider. Make sure you discuss any questions you have with your health care provider. ?Document Revised: 08/22/2020 Document Reviewed: 08/22/2020 ?Elsevier Patient Education ? 2022 [employer] ? ?

## 2021-09-13 NOTE — Progress Notes (Signed)
? ?BP 115/74   Pulse 76   Temp 98 ?F (36.7 ?C) (Oral)   Ht _0  (1.651 m)   Wt 171 lb 9.6 oz (77.8 kg)   SpO2 94%   BMI 28.56 kg/m?   ? ?Subjective:  ? ? Patient ID: Joseph Burch, male    DOB: 1970-09-03, 51 y.o.   MRN: [id number] ? ?HPI: ?Joseph Burch is a 51 y.o. male ? ?Chief Complaint  ?Patient presents with  ? Eye Problem  ?  Patient is here to discuss an issue with his eye. Patient states he bumps his head on the corner and then he felt some discomfort behind his eye. Patient states he has had this happen before in the past. Patient denies having any pain or discomfort. Patient states he would like to discuss upcoming eye exam.   ? ?EYE PAIN ?He had not noticed it, his wife noticed this.  Was at friends house Saturday about a week ago and working in yard when this was noticed -- wife noticed redness later on.  Did hit side of his head on side of counter recently, but no other trauma recently -- this was day before red eye noted.  Had eye exam at [facility] last week.  Has no pain with this.  Does not put eye drops in eyes unless needed.  Does have sinus issues underlying -- has taken allergy medication on occasion.   ?Duration:  days ?Involved eye:  right ?Onset: sudden ?Quality:  no pain ?Foreign body sensation:no ?Visual impairment: no ?Eye redness: yes ?Discharge: no ?Crusting or matting of eyelids: no ?Swelling: no ?Photophobia: no ?Itching: yes ?Tearing: no ?Headache: no ?Floaters: no ?URI symptoms: no ?Contact lens use: no ?Close contacts with similar problems: no ?Eye trauma: no ?Aggravating factors:  unknown ?Alleviating factors: unknown ?Status: stable ?Treatments attempted: none ? ?Relevant past medical, surgical, family and social history reviewed and updated as indicated. Interim medical history since our last visit reviewed. ?Allergies and medications reviewed and updated. ? ?Review of Systems  ?Constitutional:  Negative for activity change, diaphoresis, fatigue and fever.  ?Eyes:  Positive  for redness. Negative for photophobia, pain, discharge, itching and visual disturbance.  ?Respiratory:  Negative for cough, chest tightness, shortness of breath and wheezing.   ?Cardiovascular:  Negative for chest pain, palpitations and leg swelling.  ?Gastrointestinal: Negative.   ?Neurological: Negative.   ?Psychiatric/Behavioral: Negative.    ? ?Per HPI unless specifically indicated above ? ?   ?Objective:  ?  ?BP 115/74   Pulse 76   Temp 98 ?F (36.7 ?C) (Oral)   Ht _1  (1.651 m)   Wt 171 lb 9.6 oz (77.8 kg)   SpO2 94%   BMI 28.56 kg/m?   ?Wt Readings from Last 3 Encounters:  ?09/13/21 171 lb 9.6 oz (77.8 kg)  ?05/17/21 173 lb 9.6 oz (78.7 kg)  ?02/08/21 171 lb 6.4 oz (77.7 kg)  ?  ?Physical Exam ?Vitals and nursing note reviewed.  ?Constitutional:   ?   General: He is awake. He is not in acute distress. ?   Appearance: He is well-developed and well-groomed. He is not ill-appearing or toxic-appearing.  ?HENT:  ?   Head: Normocephalic and atraumatic.  ?   Right Ear: Hearing normal. No drainage.  ?   Left Ear: Hearing normal. No drainage.  ?Eyes:  ?   General: Lids are normal. Lids are everted, no foreign bodies appreciated.     ?   Right eye: No  foreign body or discharge.     ?   Left eye: No foreign body or discharge.  ?   Extraocular Movements: Extraocular movements intact.  ?   Conjunctiva/sclera:  ?   Right eye: Right conjunctiva is not injected. Hemorrhage present. No chemosis or exudate. ?   Left eye: No hemorrhage. ?   Pupils: Pupils are equal, round, and reactive to light.  ?   Visual Fields: Right eye visual fields normal and left eye visual fields normal.  ?   Comments: Small conjunctival hemorrhage to lower aspect right eye.    ?Neck:  ?   Thyroid: No thyromegaly.  ?   Vascular: No carotid bruit.  ?Cardiovascular:  ?   Rate and Rhythm: Normal rate and regular rhythm.  ?   Heart sounds: Normal heart sounds, S1 normal and S2 normal. No murmur heard. ?  No gallop.  ?Pulmonary:  ?   Effort:  Pulmonary effort is normal.  ?   Breath sounds: Normal breath sounds.  ?Abdominal:  ?   General: Bowel sounds are normal. There is no distension.  ?   Palpations: Abdomen is soft.  ?Musculoskeletal:     ?   General: Normal range of motion.  ?   Cervical back: Normal range of motion and neck supple.  ?   Right lower leg: No edema.  ?   Left lower leg: No edema.  ?Lymphadenopathy:  ?   Cervical: No cervical adenopathy.  ?Skin: ?   General: Skin is warm and dry.  ?   Capillary Refill: Capillary refill takes less than 2 seconds.  ?Neurological:  ?   Mental Status: He is alert and oriented to person, place, and time.  ?   Deep Tendon Reflexes: Reflexes are normal and symmetric.  ?Psychiatric:     ?   Attention and Perception: Attention normal.     ?   Mood and Affect: Mood normal.     ?   Speech: Speech normal.     ?   Behavior: Behavior normal. Behavior is cooperative.     ?   Thought Content: Thought content normal.  ? ? ?Results for orders placed or performed in visit on 05/17/21  ?Bayer DCA Hb A1c Waived  ?Result Value Ref Range  ? HB A1C (BAYER DCA - WAIVED) 6.4 (H) 4.8 - 5.6 %  ?Basic metabolic panel  ?Result Value Ref Range  ? Glucose 149 (H) 70 - 99 mg/dL  ? BUN 18 6 - 24 mg/dL  ? Creatinine, Ser 1.07 0.76 - 1.27 mg/dL  ? eGFR 83 >59 mL/min/1.73  ? BUN/Creatinine Ratio 17 9 - 20  ? Sodium 143 134 - 144 mmol/L  ? Potassium 4.3 3.5 - 5.2 mmol/L  ? Chloride 101 96 - 106 mmol/L  ? CO2 23 20 - 29 mmol/L  ? Calcium 9.8 8.7 - 10.2 mg/dL  ?Lipid Panel w/o Chol/HDL Ratio  ?Result Value Ref Range  ? Cholesterol, Total 179 100 - 199 mg/dL  ? Triglycerides 165 (H) 0 - 149 mg/dL  ? HDL 44 >39 mg/dL  ? VLDL Cholesterol Cal 29 5 - 40 mg/dL  ? LDL Chol Calc (NIH) 106 (H) 0 - 99 mg/dL  ? ?   ?Assessment & Plan:  ? ?Problem List Items Addressed This Visit   ? ?  ? Other  ? Subconjunctival hemorrhage  ?  Acute, small to lower aspect of right eye.  Educated him on this finding.  Vision intact and no pain.  Recommend he monitor and  if any worsening return to office.  May use Visine at home to keep eye from being dry. ?  ?  ?  ? ?Follow up plan: ?Return if symptoms worsen or fail to improve.  ?

## 2021-09-13 NOTE — Assessment & Plan Note (Signed)
Acute, small to lower aspect of right eye.  Educated him on this finding.  Vision intact and no pain.  Recommend he monitor and if any worsening return to office.  May use Visine at home to keep eye from being dry. ?

## 2021-09-25 ENCOUNTER — Ambulatory Visit: Payer: Self-pay

## 2021-09-25 ENCOUNTER — Telehealth: Payer: 59

## 2021-09-25 DIAGNOSIS — E1159 Type 2 diabetes mellitus with other circulatory complications: Secondary | ICD-10-CM

## 2021-09-25 DIAGNOSIS — E1169 Type 2 diabetes mellitus with other specified complication: Secondary | ICD-10-CM

## 2021-09-25 DIAGNOSIS — R809 Proteinuria, unspecified: Secondary | ICD-10-CM

## 2021-09-25 NOTE — Patient Instructions (Signed)
Visit Information ? ?Thank you for taking time to visit with me today. Please don't hesitate to contact me if I can be of assistance to you before our next scheduled telephone appointment. ? ?Following are the goals we discussed today:  ?(Copy and paste patient goals from clinical care plan here) ? ?Our next appointment is by telephone on 12-11-2021 at 0900 am ? ?Please call the care guide team at [phone] if you need to cancel or reschedule your appointment.  ? ?If you are experiencing a Mental Health or [place] or need someone to talk to, please call the Suicide and Crisis Lifeline: 988 ?call the [place] National Suicide Prevention Lifeline: 434-825-5998 or TTY: 508 718 4646 TTY (205)738-8439) to talk to a trained counselor ?call 1-800-273-TALK (toll free, 24 hour hotline)  ? ?Patient verbalizes understanding of instructions and care plan provided today and agrees to view in [place]. Active MyChart status confirmed with patient.   ? ?Telephone follow up appointment with care management team member scheduled for: 12-11-2021 at 0900 am ? ?Noreene Larsson RN, MSN, CCM ?Community Care Coordinator ?[facility] ?[place] ?Mobile: [phone]  ?

## 2021-09-25 NOTE — Chronic Care Management (AMB) (Signed)
? Care Management ?  ? RN Visit Note ? ?09/25/2021 ?Name: Joseph Burch MRN: [id number] DOB: August 17, 1970 ? ?Subjective: ?Joseph Burch is a 51 y.o. year old male who is a primary care patient of Cannady, Dorie RankJolene T, NP. The care management team was consulted for assistance with disease management and care coordination needs.   ? ?Engaged with patient by telephone for follow up visit in response to provider referral for case management and/or care coordination services.  ? ?Consent to Services:  ? Joseph Burch was given information about Care Management services today including:  ?Care Management services includes personalized support from designated clinical staff supervised by his physician, including individualized plan of care and coordination with other care providers ?24/7 contact phone numbers for assistance for urgent and routine care needs. ?The patient may stop case management services at any time by phone call to the office staff. ? ?Patient agreed to services and consent obtained.  ? ?Assessment: Review of patient past medical history, allergies, medications, health status, including review of consultants reports, laboratory and other test data, was performed as part of comprehensive evaluation and provision of chronic care management services.  ? ?SDOH (Social Determinants of Health) assessments and interventions performed:   ? ?Care Plan ? ?Allergies  ?Allergen Reactions  ? Atorvastatin Other (See Comments)  ?  Headache side effect  ? Guaifenesin Itching  ? Codeine Rash  ? Other Rash  ? ? ?Outpatient Encounter Medications as of 09/25/2021  ?Medication Sig  ? amLODipine (NORVASC) 10 MG tablet Take 1 tablet (10 mg total) by mouth daily.  ? CVS Lancets Thin 26G MISC To check blood sugar 2-3 times daily.  ? ertugliflozin L-PyroglutamicAc (STEGLATRO) 5 MG TABS tablet Take 1 tablet (5 mg total) by mouth daily.  ? glucose blood (CVS GLUCOSE METER TEST STRIPS) test strip To check blood sugar 2-3 times daily.  ?  metFORMIN (GLUCOPHAGE XR) 500 MG 24 hr tablet Take 2 tablets (1,000 mg total) by mouth 2 (two) times daily.  ? rosuvastatin (CRESTOR) 10 MG tablet Take one tablet (10 MG) by mouth three days a week; Monday, Wednesday, Friday.  ? ?No facility-administered encounter medications on file as of 09/25/2021.  ? ? ?Patient Active Problem List  ? Diagnosis Date Noted  ? Subconjunctival hemorrhage 09/13/2021  ? Onychomycosis 05/17/2021  ? Tinnitus of both ears 02/08/2021  ? Allergic rhinitis 11/07/2020  ? BMI 29.0-29.9,adult 11/07/2020  ? Hyperlipidemia associated with type 2 diabetes mellitus (HCC) 11/03/2020  ? Hypertension associated with diabetes (HCC) 11/22/2019  ? Type 2 diabetes mellitus with proteinuria (HCC) 11/22/2019  ? 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 ? ?Conditions to be addressed/monitored: HTN, HLD, and DMII ? ?Care Plan : RNCM: Management of Chronic Conditions: HTN, HLD, DM2  ?Updates made by [name], [name], RN since 09/25/2021 12:00 AM  ?  ? ?Problem: RNCM: Chronic disese management care plan for HTN, HLD, DM2   ?Priority: High  ?  ? ?Long-Range Goal: RNCM: Management of HTN, HLD, DM2   ?Start Date: 01/16/2021  ?Expected End Date: 01/16/2022  ?Recent Progress: On track  ?Priority: High  ?Note:   ?Current Barriers:  ?Knowledge Deficits related to plan of care for management of HTN, HLD, and DMII  ?Care Coordination needs related to Level of care concerns in a patient with HTN, HLD, and DMII ?Chronic Disease Management support and education needs related to HTN, HLD, and DMII ?Non-adherence to prescribed medication regimen ? ?RNCM Clinical Goal(s):  ?Patient will verbalize understanding  of plan for management of HTN, HLD, and DMII ?work with RN Case Manager to address needs related to HTN, HLD, and DMII and Level of care concerns ?take all medications exactly as prescribed and will call provider for medication related questions ?attend all scheduled medical appointments: 10-11-2021 ?demonstrate a decrease in HTN,  HLD, and DMII exacerbations ?demonstrate improved adherence to prescribed treatment plan for HTN, HLD, and DMII ?demonstrate improved health management independence ?verbalize basic understanding of HTN, HLD, and DMII disease process and self health management plan ?demonstrate understanding of rationale for each prescribed medication ?demonstrate ongoing self health care management ability through collaboration with RN Care manager, provider, and care team.  ? ?Interventions: ?1:1 collaboration with Aura Dials, NP, regarding development and update of comprehensive plan of care as evidenced by provider attestation and co-signature ?Inter-disciplinary care team collaboration (see longitudinal plan of care) ? ? ?Diabetes:  (Status: Goal on track: YES.) ?Lab Results  ?Component Value Date  ? HGBA1C 6.4 (H) 05/17/2021  ?*A1C on DOT physical on 11-02-2020 was 9.3- on 02-08-2021 new value 6.6% ?Will have new A1C on 10-11-2021 for DOT physical ?Assessed patient's understanding of A1c goal: <7% ?Provided education to patient about basic DM disease process; ?Reviewed medications with patient and discussed importance of medication adherence, patient states compliance with medications. Patient had been off of medications x 3 months but is consistently taking. 05-21-2021: The patient is compliant with medications. His goal is to effectively manage his conditions and hopefully come off of some of the medications. 09-25-2021: The patient is compliant with medications and denies any acute findings with medications management for DM. He has added "Bitter melon" to his regimen and feels this is helping as well with his DM levels being more stable ; ?Counseled on importance of regular laboratory monitoring as prescribed, discussed hemoglobin A1C goal and likelihood of testing at next appointment with pcp. The patient is hopeful that his A1C is going to be less than 9.3. 03-01-2021: Newest A1C level is 6.6 on 02-08-2021. 05-21-2021:  Newest A1C 6.4 % on 05-17-2021. Praised the patient for hard work on getting A1C levels down and monitoring blood sugars.  The patient states that he has lost about 20 pounds and he is also eating better. The patient has made positive changes in his dietary habits. 09-25-2021: Is having regular lab testing for levels. Is compliant and continues to make positive changes in lifestyle to be healthy and maintain health and well being.  ?Discussed plans with patient for ongoing care management follow up and provided patient with direct contact information for care management team; ?Provided patient with written educational materials related to hypo and hyperglycemia and importance of correct treatment- will send information through [facility] and My chart. 09-25-2021: Denies any issues with hypo and hyperglycemia today. Is monitoring for changes; ?Reviewed scheduled/upcoming provider appointments including: 10-11-2021 ?Podiatry check on 06-07-2021 with trimming of nails and evaluation of feet ?Advised patient, providing education and rationale, to check cbg bid and record, calling pcp for findings outside established parameters, the patient is checking his blood sugars on the weekends. 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  Encouraged the patient to check as much as possible when he is out on the road. Patient reports fasting is usually around 115 and post prandial numbers are 165-180. Review of goals of fasting <130 and post prandial of <180, also discussed 15 minutes of moderate exercise could help the patient with  lowering blood sugars. The patient advised it is hard to have an exercise routine on the road. 03-01-2021: The patient checks his blood sugars when he is home. He states fasting it is 115 in the mornings and usually always <180 after eating. States he eats a lot of chicken. Does like to eat bacon. Discussed healthier options and only eating bacon once in a while.  03-05-2021: The patient states when he was having bad headache after taking atorvastatin his blood sugar was 112.  05-21-2021: The patient is doing well and has made positive changes to improve his health and well be

## 2021-10-04 ENCOUNTER — Ambulatory Visit: Payer: 59 | Admitting: Nurse Practitioner

## 2021-10-05 NOTE — Patient Instructions (Signed)

## 2021-10-09 ENCOUNTER — Other Ambulatory Visit: Payer: Self-pay | Admitting: Nurse Practitioner

## 2021-10-09 DIAGNOSIS — E1129 Type 2 diabetes mellitus with other diabetic kidney complication: Secondary | ICD-10-CM

## 2021-10-09 DIAGNOSIS — E1169 Type 2 diabetes mellitus with other specified complication: Secondary | ICD-10-CM

## 2021-10-09 DIAGNOSIS — E1159 Type 2 diabetes mellitus with other circulatory complications: Secondary | ICD-10-CM

## 2021-10-10 ENCOUNTER — Other Ambulatory Visit: Payer: 59

## 2021-10-10 DIAGNOSIS — E1159 Type 2 diabetes mellitus with other circulatory complications: Secondary | ICD-10-CM

## 2021-10-10 DIAGNOSIS — E1129 Type 2 diabetes mellitus with other diabetic kidney complication: Secondary | ICD-10-CM | POA: Diagnosis not present

## 2021-10-10 DIAGNOSIS — E1169 Type 2 diabetes mellitus with other specified complication: Secondary | ICD-10-CM

## 2021-10-10 DIAGNOSIS — I152 Hypertension secondary to endocrine disorders: Secondary | ICD-10-CM | POA: Diagnosis not present

## 2021-10-10 DIAGNOSIS — R809 Proteinuria, unspecified: Secondary | ICD-10-CM | POA: Diagnosis not present

## 2021-10-10 DIAGNOSIS — E785 Hyperlipidemia, unspecified: Secondary | ICD-10-CM | POA: Diagnosis not present

## 2021-10-10 LAB — URINALYSIS, ROUTINE W REFLEX MICROSCOPIC
Bilirubin, UA: NEGATIVE
Ketones, UA: NEGATIVE
Leukocytes,UA: NEGATIVE
Nitrite, UA: NEGATIVE
Protein,UA: NEGATIVE
Specific Gravity, UA: 1.025 (ref 1.005–1.030)
Urobilinogen, Ur: 0.2 mg/dL (ref 0.2–1.0)
pH, UA: 5.5 (ref 5.0–7.5)

## 2021-10-10 LAB — MICROALBUMIN, URINE WAIVED
Creatinine, Urine Waived: 50 mg/dL (ref 10–300)
Microalb, Ur Waived: 30 mg/L — ABNORMAL HIGH (ref 0–19)

## 2021-10-10 LAB — MICROSCOPIC EXAMINATION
Bacteria, UA: NONE SEEN
Epithelial Cells (non renal): NONE SEEN /hpf (ref 0–10)
WBC, UA: NONE SEEN /hpf (ref 0–5)

## 2021-10-10 LAB — BAYER DCA HB A1C WAIVED: HB A1C (BAYER DCA - WAIVED): 6.3 % — ABNORMAL HIGH (ref 4.8–5.6)

## 2021-10-11 ENCOUNTER — Ambulatory Visit (INDEPENDENT_AMBULATORY_CARE_PROVIDER_SITE_OTHER): Payer: Self-pay | Admitting: Nurse Practitioner

## 2021-10-11 ENCOUNTER — Encounter: Payer: Self-pay | Admitting: Nurse Practitioner

## 2021-10-11 VITALS — BP 123/84 | HR 64 | Temp 97.8°F | Ht 66.25 in | Wt 170.2 lb

## 2021-10-11 DIAGNOSIS — N029 Recurrent and persistent hematuria with unspecified morphologic changes: Secondary | ICD-10-CM | POA: Insufficient documentation

## 2021-10-11 DIAGNOSIS — E1129 Type 2 diabetes mellitus with other diabetic kidney complication: Secondary | ICD-10-CM

## 2021-10-11 DIAGNOSIS — E1169 Type 2 diabetes mellitus with other specified complication: Secondary | ICD-10-CM

## 2021-10-11 DIAGNOSIS — E1159 Type 2 diabetes mellitus with other circulatory complications: Secondary | ICD-10-CM

## 2021-10-11 DIAGNOSIS — Z0289 Encounter for other administrative examinations: Secondary | ICD-10-CM

## 2021-10-11 LAB — COMPREHENSIVE METABOLIC PANEL
ALT: 24 IU/L (ref 0–44)
AST: 23 IU/L (ref 0–40)
Albumin/Globulin Ratio: 1.8 (ref 1.2–2.2)
Albumin: 4.6 g/dL (ref 3.8–4.9)
Alkaline Phosphatase: 99 IU/L (ref 44–121)
BUN/Creatinine Ratio: 21 — ABNORMAL HIGH (ref 9–20)
BUN: 22 mg/dL (ref 6–24)
Bilirubin Total: 0.3 mg/dL (ref 0.0–1.2)
CO2: 23 mmol/L (ref 20–29)
Calcium: 10.6 mg/dL — ABNORMAL HIGH (ref 8.7–10.2)
Chloride: 102 mmol/L (ref 96–106)
Creatinine, Ser: 1.04 mg/dL (ref 0.76–1.27)
Globulin, Total: 2.5 g/dL (ref 1.5–4.5)
Glucose: 177 mg/dL — ABNORMAL HIGH (ref 70–99)
Potassium: 4.3 mmol/L (ref 3.5–5.2)
Sodium: 140 mmol/L (ref 134–144)
Total Protein: 7.1 g/dL (ref 6.0–8.5)
eGFR: 86 mL/min/{1.73_m2} (ref >59)

## 2021-10-11 LAB — LIPID PANEL W/O CHOL/HDL RATIO
Cholesterol, Total: 122 mg/dL (ref 100–199)
HDL: 42 mg/dL (ref >39)
LDL Chol Calc (NIH): 61 mg/dL (ref 0–99)
Triglycerides: 101 mg/dL (ref 0–149)
VLDL Cholesterol Cal: 19 mg/dL (ref 5–40)

## 2021-10-11 NOTE — Progress Notes (Signed)
Reviewed labs in office with patient today.

## 2021-10-11 NOTE — Progress Notes (Signed)
? ?BP 123/84   Pulse 64   Temp 97.8 ?F (36.6 ?C) (Oral)   Ht 5' 6.25" (1.683 m)   Wt 170 lb 3.2 oz (77.2 kg)   SpO2 96%   BMI 27.26 kg/m?   ? ?Subjective:  ? ? Patient ID: Joseph AcreJames R Dutta, male    DOB: 1970/07/16, 51 y.o.   MRN: [id number] ? ?HPI: ?Joseph Burch is a 51 y.o. male presenting on 10/11/2021 for DOT examination.  ? 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  No recent incidents.  Has driven transport for 9 years.  Does have underlying T2DM which is well-controlled, with recent A1c 6.3% (two days ago) and urine ALB 30.  Continues on Steglatro, Metformin, Amlodipine, and Rosuvastatin.  Is scheduled for eye exam at the end of this month. ? ?FALL RISK: ? ?  10/11/2021  ?  9:13 AM 12/07/2020  ? 10:30 AM 11/21/2019  ? 11:46 AM  ?Fall Risk   ?Falls in the past year? 0 0 0  ?Number falls in past yr: 0 0   ?Injury with Fall? 0 0   ?Risk for fall due to : No Fall Risks No Fall Risks   ?Follow up Falls evaluation completed Falls evaluation completed Falls evaluation completed  ? ? ?Depression Screen ? ?  10/11/2021  ?  9:12 AM 09/13/2021  ?  4:29 PM 12/07/2020  ? 10:30 AM 01/23/2020  ?  9:13 AM  ?Depression screen PHQ 2/9  ?Decreased Interest 0 0 0 0  ?Down, Depressed, Hopeless 0 0 0 0  ?PHQ - 2 Score 0 0 0 0  ?Altered sleeping 0 0  0  ?Tired, decreased energy 0 1  0  ?Change in appetite 0 0  0  ?Feeling bad or failure about yourself  0 0  0  ?Trouble concentrating 0 0  0  ?Moving slowly or fidgety/restless 0 0  0  ?Suicidal thoughts 0 0  0  ?PHQ-9 Score 0 1  0  ?Difficult doing work/chores Not difficult at all   Not difficult at all  ? ? ?Advanced Directives ?<no information> ? ?Past Medical History:  ?Past Medical History:  ?Diagnosis Date  ? Diabetes mellitus without complication (HCC)   ? Hematuria   ? onset 29 years ago- cause unkown to the patient  ? Hypertension   ? ? ?Surgical History:  ?Past Surgical History:  ?Procedure  Laterality Date  ? VASECTOMY    ? ? ?Medications:  ?Current Outpatient Medications on File Prior to Visit  ?Medication Sig  ? amLODipine (NORVASC) 10 MG tablet Take 1 tablet (10 mg total) by mouth daily.  ? CVS Lancets Thin 26G MISC To check blood sugar 2-3 times daily.  ? ertugliflozin L-PyroglutamicAc (STEGLATRO) 5 MG TABS tablet Take 1 tablet (5 mg total) by mouth daily.  ? glucose blood (CVS GLUCOSE METER TEST STRIPS) test strip To check blood sugar 2-3 times daily.  ? metFORMIN (GLUCOPHAGE XR) 500 MG 24 hr tablet Take 2 tablets (1,000 mg total) by mouth 2 (two) times daily.  ? rosuvastatin (CRESTOR) 10 MG tablet Take one tablet (10 MG) by mouth three days a week; Monday, Wednesday, Friday.  ? ?No current facility-administered medications on file prior to visit.  ? ? ?Allergies:  ?Allergies  ?Allergen Reactions  ? Atorvastatin Other (See Comments)  ?  Headache side effect  ? Guaifenesin Itching  ?  Codeine Rash  ? Other Rash  ? ? ?Social History:  ?Social History  ? ?Socioeconomic History  ? Marital status: Married  ?  Spouse name: Not on file  ? Number of children: Not on file  ? Years of education: Not on file  ? Highest education level: Associate degree: occupational, Scientist, product/process development, or vocational program  ?Occupational History  ? Occupation: Naval architect  ?Tobacco Use  ? Smoking status: Former  ?  Types: E-cigarettes, Cigarettes  ?  Quit date: 11/05/2011  ?  Years since quitting: 9.9  ? Smokeless tobacco: Never  ?Vaping Use  ? Vaping Use: Never used  ?Substance and Sexual Activity  ? Alcohol use: Yes  ?  Comment: rare beer 1-2 per summer  ? Drug use: Never  ? Sexual activity: Yes  ?Other Topics Concern  ? Not on file  ?Social History Narrative  ? Married lives with wife and children x 4.   ? ?Social Determinants of Health  ? ?Financial Resource Strain: Low Risk   ? Difficulty of Paying Living Expenses: Not very hard  ?Food Insecurity: No Food Insecurity  ? Worried About Programme researcher, broadcasting/film/video in the Last Year: Never  true  ? Ran Out of Food in the Last Year: Never true  ?Transportation Needs: No Transportation Needs  ? Lack of Transportation (Medical): No  ? Lack of Transportation (Non-Medical): No  ?Physical Activity: Sufficiently Active  ? Days of Exercise per Week: 4 days  ? Minutes of Exercise per Session: 50 min  ?Stress: No Stress Concern Present  ? Feeling of Stress : Only a little  ?Social Connections: Moderately Integrated  ? Frequency of Communication with Friends and Family: More than three times a week  ? Frequency of Social Gatherings with Friends and Family: More than three times a week  ? Attends Religious Services: 1 to 4 times per year  ? Active Member of Clubs or Organizations: No  ? Attends Banker Meetings: Never  ? Marital Status: Married  ?Intimate Partner Violence: Not At Risk  ? Fear of Current or Ex-Partner: No  ? Emotionally Abused: No  ? Physically Abused: No  ? Sexually Abused: No  ? ?Social History  ? ?Tobacco Use  ?Smoking Status Former  ? Types: E-cigarettes, Cigarettes  ? Quit date: 11/05/2011  ? Years since quitting: 9.9  ?Smokeless Tobacco Never  ? ?Social History  ? ?Substance and Sexual Activity  ?Alcohol Use Yes  ? Comment: rare beer 1-2 per summer  ? ? ?Family History:  ?Family History  ?Problem Relation Age of Onset  ? Alcohol abuse Mother   ? Cancer Mother   ? COPD Mother   ? Mental illness Mother   ? Cancer Father   ? Diabetes Father   ? Hypertension Father   ? Drug abuse Sister   ? Early death Brother   ? ? ?Past medical history, surgical history, medications, allergies, family history and social history reviewed with patient today and changes made to appropriate areas of the chart.  ? ?ROS ?All other ROS negative except what is listed above and in the HPI.  ? ?   ?Objective:  ?  ?BP 123/84   Pulse 64   Temp 97.8 ?F (36.6 ?C) (Oral)   Ht 5' 6.25" (1.683 m)   Wt 170 lb 3.2 oz (77.2 kg)   SpO2 96%   BMI 27.26 kg/m?   ?Wt Readings from Last 3 Encounters:  ?10/11/21 170 lb  3.2 oz (77.2  kg)  ?09/13/21 171 lb 9.6 oz (77.8 kg)  ?05/17/21 173 lb 9.6 oz (78.7 kg)  ?  ?Hearing Screening  ? 500Hz  1000Hz  2000Hz  4000Hz  5000Hz   ?Right ear Pass Pass Pass Pass   ?Left ear Pass Pass Pass Pass Pass  ? ?Vision Screening  ? Right eye Left eye Both eyes  ?Without correction 20/25 20/25 20/20   ?With correction     ? ? ?Physical Exam ?Vitals and nursing note reviewed.  ?Constitutional:   ?   General: He is awake. He is not in acute distress. ?   Appearance: He is well-developed and well-groomed. He is not ill-appearing or toxic-appearing.  ?HENT:  ?   Head: Normocephalic and atraumatic.  ?   Right Ear: Hearing, tympanic membrane, ear canal and external ear normal. No drainage.  ?   Left Ear: Hearing, tympanic membrane, ear canal and external ear normal. No drainage.  ?   Ears:  ?   Comments: Able to hear whisper test at 5 feet. ?   Nose: Nose normal.  ?   Mouth/Throat:  ?   Pharynx: Uvula midline.  ?Eyes:  ?   General: Lids are normal.     ?   Right eye: No discharge.     ?   Left eye: No discharge.  ?   Extraocular Movements: Extraocular movements intact.  ?   Conjunctiva/sclera: Conjunctivae normal.  ?   Pupils: Pupils are equal, round, and reactive to light.  ?   Visual Fields: Right eye visual fields normal and left eye visual fields normal.  ?   Comments: 70 degree field of vision bilaterally.  ?Neck:  ?   Thyroid: No thyromegaly.  ?   Vascular: No carotid bruit or JVD.  ?   Trachea: Trachea normal.  ?Cardiovascular:  ?   Rate and Rhythm: Normal rate and regular rhythm.  ?   Heart sounds: Normal heart sounds, S1 normal and S2 normal. No murmur heard. ?  No gallop.  ?Pulmonary:  ?   Effort: Pulmonary effort is normal. No accessory muscle usage or respiratory distress.  ?   Breath sounds: Normal breath sounds.  ?Abdominal:  ?   General: Bowel sounds are normal.  ?   Palpations: Abdomen is soft. There is no hepatomegaly or splenomegaly.  ?   Tenderness: There is no abdominal tenderness.   ?Musculoskeletal:     ?   General: Normal range of motion.  ?   Cervical back: Normal range of motion and neck supple.  ?   Right lower leg: No edema.  ?   Left lower leg: No edema.  ?Lymphadenopathy:  ?   Head:  ?   Right side of hea

## 2021-10-25 DIAGNOSIS — E119 Type 2 diabetes mellitus without complications: Secondary | ICD-10-CM | POA: Diagnosis not present

## 2021-10-25 LAB — HM DIABETES EYE EXAM

## 2021-12-07 ENCOUNTER — Other Ambulatory Visit: Payer: Self-pay | Admitting: Nurse Practitioner

## 2021-12-09 NOTE — Telephone Encounter (Signed)
Requested Prescriptions  Pending Prescriptions Disp Refills  . STEGLATRO 5 MG TABS tablet [Pharmacy Med Name: Steglatro 5 MG Oral Tablet] 30 tablet 5    Sig: Take 1 tablet by mouth once daily     Endocrinology: Diabetes - SGLT2 Inhibitors - ertugliflozin Passed - 12/07/2021 12:46 PM      Passed - Cr in normal range and within 360 days    Creatinine, Ser  Date Value Ref Range Status  10/10/2021 1.04 0.76 - 1.27 mg/dL Final   Creatinine,U  Date Value Ref Range Status  11/21/2019 118.3 mg/dL Final         Passed - HBA1C is between 0 and 7.9 and within 180 days    HB A1C (BAYER DCA - WAIVED)  Date Value Ref Range Status  10/10/2021 6.3 (H) 4.8 - 5.6 % Final    Comment:             Prediabetes: 5.7 - 6.4          Diabetes: >6.4          Glycemic control for adults with diabetes: <7.0          Passed - eGFR is 40 or above and within 360 days    GFR calc Af Amer  Date Value Ref Range Status  09/24/2019 >60 >60 mL/min Final   GFR calc non Af Amer  Date Value Ref Range Status  09/24/2019 >60 >60 mL/min Final   GFR  Date Value Ref Range Status  11/21/2019 68.44 >60.00 mL/min Final   eGFR  Date Value Ref Range Status  10/10/2021 86 >59 mL/min/1.73 Final         Passed - Valid encounter within last 6 months    Recent Outpatient Visits          1 month ago Encounter for examination required by Department of Transportation (DOT)   [place], [place] T, NP   2 months ago Subconjunctival hemorrhage of right eye   [place], Jolene T, NP   6 months ago Type 2 diabetes mellitus with proteinuria ([place])   [place], Jolene T, NP   10 months ago Type 2 diabetes mellitus with proteinuria ([place])   [place], Jolene T, NP   1 year ago Type 2 diabetes mellitus with proteinuria ([place])   [place], Barbaraann Faster, NP      Future Appointments            In 4 months [place],  Barbaraann Faster, NP [employer]

## 2021-12-11 ENCOUNTER — Telehealth: Payer: Self-pay

## 2021-12-11 ENCOUNTER — Ambulatory Visit: Payer: Self-pay

## 2021-12-11 DIAGNOSIS — E1159 Type 2 diabetes mellitus with other circulatory complications: Secondary | ICD-10-CM

## 2021-12-11 DIAGNOSIS — E1169 Type 2 diabetes mellitus with other specified complication: Secondary | ICD-10-CM

## 2021-12-11 DIAGNOSIS — E1129 Type 2 diabetes mellitus with other diabetic kidney complication: Secondary | ICD-10-CM

## 2021-12-11 NOTE — Patient Instructions (Signed)
Visit Information  Thank you for taking time to visit with me today. Please don't hesitate to contact me if I can be of assistance to you before our next scheduled telephone appointment.  Following are the goals we discussed today:  Diabetes:  (Status: Goal Met.) 12-11-2021: Goal met and the patient is doing well. Closing plan of care      Lab Results  Component Value Date    HGBA1C 6.3 (H) 10/10/2021  *A1C on DOT physical on 11-02-2020 was 9.3- on 02-08-2021 new value 6.6% Will have new A1C on 10-11-2021 for DOT physical- A1C 6.3% Assessed patient's understanding of A1c goal: <7% Provided education to patient about basic DM disease process; Reviewed medications with patient and discussed importance of medication adherence, patient states compliance with medications. Patient had been off of medications x 3 months but is consistently taking. 05-21-2021: The patient is compliant with medications. His goal is to effectively manage his conditions and hopefully come off of some of the medications. 09-25-2021: The patient is compliant with medications and denies any acute findings with medications management for DM. He has added "Bitter melon" to his regimen and feels this is helping as well with his DM levels being more stable. 12-11-2021: The patient remains compliant with medications and is at goal. ; Counseled on importance of regular laboratory monitoring as prescribed, discussed hemoglobin A1C goal and likelihood of testing at next appointment with pcp. The patient is hopeful that his A1C is going to be less than 9.3. 03-01-2021: Newest A1C level is 6.6 on 02-08-2021. 05-21-2021: Newest A1C 6.4 % on 05-17-2021. Praised the patient for hard work on getting A1C levels down and monitoring blood sugars.  The patient states that he has lost about 20 pounds and he is also eating better. The patient has made positive changes in his dietary habits. 09-25-2021: Is having regular lab testing for levels. Is compliant and  continues to make positive changes in lifestyle to be healthy and maintain health and well being.  Discussed plans with patient for ongoing care management follow up and provided patient with direct contact information for care management team; Provided patient with written educational materials related to hypo and hyperglycemia and importance of correct treatment- will send information through [facility] and My chart. 09-25-2021: Denies any issues with hypo and hyperglycemia today. Is monitoring for changes; Reviewed scheduled/upcoming provider appointments including: 10-11-2021 Podiatry check on 06-07-2021 with trimming of nails and evaluation of feet Advised patient, providing education and rationale, to check cbg bid and record, calling pcp for findings outside established parameters, the patient is checking his blood sugars on the weekends. 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  Encouraged the patient to check as much as possible when he is out on the road. Patient reports fasting is usually around 115 and post prandial numbers are 165-180. Review of goals of fasting <130 and post prandial of <180, also discussed 15 minutes of moderate exercise could help the patient with lowering blood sugars. The patient advised it is hard to have an exercise routine on the road. 03-01-2021: The patient checks his blood sugars when he is home. He states fasting it is 115 in the mornings and usually always <180 after eating. States he eats a lot of chicken. Does like to eat bacon. Discussed healthier options and only eating bacon once in a while. 03-05-2021: The patient states when he was having bad headache after taking atorvastatin  his blood sugar was 112.  05-21-2021: The patient is doing well and has made positive changes to improve his health and well being. The patient states that he has lost 20 pounds and is also eating better. He has cut out sodas and mainly drinks  water. The patient states that if he does have a soda he drinks sprite zero. Praised the patient for accomplishments. Encouraged the patient to continue on positive lifestyle changes. Will continue to monitor. 07-23-2021: The patient states his range for blood sugars is around 114. The patient states that on the road he is even cut out breads, pasta, and starches that make his blood sugars go up. He continues to eat healthy at home as well as his wife eats a keto diet and he is eating the same things she is at home. 09-25-2021: The patient is taking his blood sugars more frequently and feels his A1C is going to be really good this next time. He is seeing numbers a little over 100 fasting. The patient has made a lot of healthy changes in dietary and other means. Feels great.  Review of patient status, including review of consultants reports, relevant laboratory and other test results, and medications completed. 07-23-2021: The patient was asking about DOT physical if it could be changed to every 2 years since he was seeing the provider regularly. Advised the patient to discuss with the pcp at next appointment. 09-25-2021: The patient will have DOT physical on 10-11-2021.  Screening for signs and symptoms of depression related to chronic disease stat. 09-25-2021: No issues related to changes in mood, anxiety, or depression. States he feels great and is doing very well;  Assessed social determinant of health barriers;    Hyperlipidemia:  (Status: Goal Met.) 12-11-2021: Goal met and closing plan of care      Lab Results  Component Value Date    CHOL 179 05/17/2021    HDL 44 05/17/2021    LDLCALC 106 (H) 05/17/2021    TRIG 165 (H) 05/17/2021    CHOLHDL 4 11/21/2019    Medication review performed; medication list updated in electronic medical record. 09-25-2021: The patient endorses taking Crestor 10 mg three times a week. He is compliant and is hopeful this will bring his cholesterol level in range.  Provider  established cholesterol goals reviewed, 03-01-2021: Reviewed the labwork and notes from the pcp on 02-08-2021. The patients LDL is 106 and goal is <70.  The patient does not want to take another pill but states if his pcp feels like this is something he needs to do he will do it. Recommended dietary changes as well to help with lowering of LDL.  Education and support given. The patient agrees to start the atorvastatin 10 mg and ask that it be sent in to [place] and he will pick up with his other refills. In basket message sent to pcp with request after review of the pcp notes with the patient. Advised the patient to call with any questions or concerns.  03-05-2021: The patient called the RNCM and states he cannot take the new medication for cholesterol. He took it for 2 days and states he had throbbing pain behind his eyes and forehead both days  he took the medication. He took it for 2 days and it did this both days. The patient has stopped taking the medication and states he cannot function like this. The patient states he will work on his diet and try to get levels down by  dietary measures. The patient states he had to go to bed for 3 or 4 hours just to function again. Blood pressures was 135/80 and all other vital signs were in normal limits. The patient talked about fixing his bacon in an air fryer and eating quail eggs. Will notify the pcp of changes and the patient stopping the medication. 05-21-2021: The patient has a new script for Crestor (Rosuvastatin 10 mg) to take 3 times a week for his cholesterol. The patient has not picked it up yet but will when he gets in tomorrow. The patient states that he hopes he doesn't have issues like he did with the other medication. Education and support given. The patient knows to call the office for changes in his condition or problems with medications. 09-25-2021: The patient is taking Crestor as prescribed and the patient is also changing eating habits and eating  healthier. The patient denies any acute changes in HLD health and feels with medications and changes his cholesterol levels with me better when checked again.  Counseled on importance of regular laboratory monitoring as prescribed; Provided HLD educational materials; Reviewed role and benefits of statin for ASCVD risk reduction; Discussed strategies to manage statin-induced myalgias; Reviewed importance of limiting foods high in cholesterol. 03-05-2021: Reviewed and gave suggestions for foods that support heart health and lower cholesterol. 09-25-2021: Reviewed foods that the patient has been eating. Praised for positive changes in food choices. The patient is staying away from pastas, breads and potatoes. He is also using a supplement called "bitter melon" that he says is really helping with his blood pressures and DM health and well being. ;  Screening for signs and symptoms of depression related to chronic disease state;  Assessed social determinant of health barriers;    Hypertension: (Status: Goal Met.) 12-11-2021: Goal met and plan of care completed. Closed  Last practice recorded BP readings:     BP Readings from Last 3 Encounters:  10/11/21 123/84  09/13/21 115/74  05/17/21 114/75  Reading when having issues with atorvastatin was 135/80 Most recent eGFR/CrCl:       Lab Results  Component Value Date    EGFR 86 10/10/2021    No components found for: CRCL   Evaluation of current treatment plan related to hypertension self management and patient's adherence to plan as established by provider. 09-25-2021: The patient has more stable blood pressures at this time. Denies any acute needs. States that he is taking his medications as prescribed. No new concerns with HTN management or heart health..   Provided education to patient re: stroke prevention, s/s of heart attack and stroke; Reviewed medications with patient and discussed importance of compliance. 09-25-2021: Is compliant with medications   Discussed plans with patient for ongoing care management follow up and provided patient with direct contact information for care management team; Advised patient, providing education and rationale, to monitor blood pressure daily and record, calling PCP for findings outside established parameters;  Reviewed scheduled/upcoming provider appointments including, 10-11-2021 for follow up with the pcp   Provided education on prescribed diet heart healthy/ADA. 09-25-2021 Review with the patient- recommendations provided. Praised the patient for positive changes in dietary habits. He is eating better when he is out on the road and he is also eating better at home and following a keto diet with his wife when he is at home and not on the road  Discussed complications of poorly controlled blood pressure such as heart disease, stroke, circulatory complications, vision complications, kidney impairment,  sexual dysfunction;  09-25-2021: States his eye condition has now resolved. Denies any new issues. Will continue to monitor for changes.       Please call the care guide team at [phone] if you need to cancel or reschedule your appointment.   If you are experiencing a Mental Health or [place] or need someone to talk to, please call the Suicide and Crisis Lifeline: 988 call the [place] National Suicide Prevention Lifeline: 873-166-4255 or TTY: 272-761-3783 TTY (762) 879-2896) to talk to a trained counselor call 1-800-273-TALK (toll free, 24 hour hotline)   Patient verbalizes understanding of instructions and care plan provided today and agrees to view in [place]. Active MyChart status and patient understanding of how to access instructions and care plan via MyChart confirmed with patient.     No further follow up required: patient has met the goals of care and care plan has been closed. The patient has the [facility] number to call for new concerns or changes.  Noreene Larsson RN, MSN, [facility] Mobile: [phone]

## 2021-12-11 NOTE — Chronic Care Management (AMB) (Signed)
Care Management    RN Visit Note  12/11/2021 Name: Joseph Burch MRN: [id number] DOB: [birth year]  Subjective: Joseph Burch is a 51 y.o. year old male who is a primary care patient of Cannady, Barbaraann Faster, NP. The care management team was consulted for assistance with disease management and care coordination needs.    Engaged with patient by telephone for follow up visit in response to provider referral for case management and/or care coordination services.   Consent to Services:   Joseph Burch was given information about Care Management services today including:  Care Management services includes personalized support from designated clinical staff supervised by his physician, including individualized plan of care and coordination with other care providers 24/7 contact phone numbers for assistance for urgent and routine care needs. The patient may stop case management services at any time by phone call to the office staff.  Patient agreed to services and consent obtained.   Assessment: Review of patient past medical history, allergies, medications, health status, including review of consultants reports, laboratory and other test data, was performed as part of comprehensive evaluation and provision of chronic care management services.   SDOH (Social Determinants of Health) assessments and interventions performed:    Care Plan  Allergies  Allergen Reactions   Atorvastatin Other (See Comments)    Headache side effect   Guaifenesin Itching   Codeine Rash   Other Rash    Outpatient Encounter Medications as of 12/11/2021  Medication Sig   amLODipine (NORVASC) 10 MG tablet Take 1 tablet (10 mg total) by mouth daily.   [employer] Lancets Thin 26G MISC To check blood sugar 2-3 times daily.   glucose blood ([employer] GLUCOSE METER TEST STRIPS) test strip To check blood sugar 2-3 times daily.   metFORMIN (GLUCOPHAGE XR) 500 MG 24 hr tablet Take 2 tablets (1,000 mg total) by mouth 2 (two) times daily.    rosuvastatin (CRESTOR) 10 MG tablet Take one tablet (10 MG) by mouth three days a week; Monday, Wednesday, Friday.   STEGLATRO 5 MG TABS tablet Take 1 tablet by mouth once daily   No facility-administered encounter medications on file as of 12/11/2021.    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 Closing care plan. Patient has RNCM information and knows to call for changes or new needs Knowledge Deficits related to plan of care for management of HTN, HLD, and DMII  Care Coordination needs related to Level of care concerns in a patient with HTN, HLD, and DMII Chronic Disease Management support and education  needs related to HTN, HLD, and DMII Non-adherence to prescribed medication regimen  RNCM Clinical Goal(s):  Patient will verbalize understanding of plan for management of HTN, HLD, and DMII work with RN Case Manager to address needs related to HTN, HLD, and DMII and Level of care concerns take all medications exactly as prescribed and will  call provider for medication related questions attend all scheduled medical appointments: 04-11-2022 demonstrate a decrease in HTN, HLD, and DMII exacerbations demonstrate improved adherence to prescribed treatment plan for HTN, HLD, and DMII demonstrate improved health management independence verbalize basic understanding of HTN, HLD, and DMII disease process and self health management plan demonstrate understanding of rationale for each prescribed medication demonstrate ongoing self health care management ability through collaboration with RN Care manager, provider, and care team.   Interventions: 1:1 collaboration with Joseph Guarneri, NP, regarding development and update of comprehensive plan of care as evidenced by provider attestation and co-signature Inter-disciplinary care team collaboration (see longitudinal plan of care)   Diabetes:  (Status: Goal Met.) 12-11-2021: Goal met and the patient is doing well. 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 Patient had been off of medications x 3 months but is consistently taking. 05-21-2021: The patient is compliant with medications. His goal is to effectively manage his conditions and hopefully come off of some of the medications. 09-25-2021: The patient is compliant with medications and denies any acute findings with medications management for DM. He has added "Bitter melon" to his regimen and feels this is helping as well with his DM levels being more stable. 12-11-2021: The patient remains compliant with medications and is at goal.  ; Counseled on importance of regular laboratory monitoring as prescribed, discussed hemoglobin A1C goal and likelihood of testing at next appointment with pcp. The patient is hopeful that his A1C is going to be less than 9.3. 03-01-2021: Newest A1C level is 6.6 on 02-08-2021. 05-21-2021: Newest A1C 6.4 % on 05-17-2021. Praised the patient for hard work on getting A1C levels down and monitoring blood sugars.  The patient states that he has lost about 20 pounds and he is also eating better. The patient has made positive changes in his dietary habits. 09-25-2021: Is having regular lab testing for levels. Is compliant and continues to make positive changes in lifestyle to be healthy and maintain health and well being.  Discussed plans with patient for ongoing care management follow up and provided patient with direct contact information for care management team; Provided patient with written educational materials related to hypo and hyperglycemia and importance of correct treatment- will send information through [facility] and My chart. 09-25-2021: Denies any issues with hypo and hyperglycemia today. Is monitoring for changes; Reviewed scheduled/upcoming provider appointments including: 10-11-2021 Podiatry check on 06-07-2021 with trimming of nails and evaluation of feet Advised patient, providing education and rationale, to check cbg bid and record, calling pcp for findings outside established parameters, the patient is checking his blood sugars on the weekends. 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  Encouraged the patient to check as much  as possible when he is out on the road. Patient reports fasting is usually around 115 and post prandial numbers are 165-180. Review of goals of fasting <130 and post prandial of <180, also discussed 15 minutes of moderate exercise could help the patient with lowering blood sugars. The patient advised it is hard to have an exercise  routine on the road. 03-01-2021: The patient checks his blood sugars when he is home. He states fasting it is 115 in the mornings and usually always <180 after eating. States he eats a lot of chicken. Does like to eat bacon. Discussed healthier options and only eating bacon once in a while. 03-05-2021: The patient states when he was having bad headache after taking atorvastatin his blood sugar was 112.  05-21-2021: The patient is doing well and has made positive changes to improve his health and well being. The patient states that he has lost 20 pounds and is also eating better. He has cut out sodas and mainly drinks water. The patient states that if he does have a soda he drinks sprite zero. Praised the patient for accomplishments. Encouraged the patient to continue on positive lifestyle changes. Will continue to monitor. 07-23-2021: The patient states his range for blood sugars is around 114. The patient states that on the road he is even cut out breads, pasta, and starches that make his blood sugars go up. He continues to eat healthy at home as well as his wife eats a keto diet and he is eating the same things she is at home. 09-25-2021: The patient is taking his blood sugars more frequently and feels his A1C is going to be really good this next time. He is seeing numbers a little over 100 fasting. The patient has made a lot of healthy changes in dietary and other means. Feels great.  Review of patient status, including review of consultants reports, relevant laboratory and other test results, and medications completed. 07-23-2021: The patient was asking about DOT physical if it could be changed to every 2 years since he was seeing the provider regularly. Advised the patient to discuss with the pcp at next appointment. 09-25-2021: The patient will have DOT physical on 10-11-2021.  Screening for signs and symptoms of depression related to chronic disease stat. 09-25-2021: No issues related to changes in mood, anxiety, or  depression. States he feels great and is doing very well;  Assessed social determinant of health barriers;   Hyperlipidemia:  (Status: Goal Met.) 12-11-2021: Goal met and closing plan of care Lab Results  Component Value Date   CHOL 179 05/17/2021   HDL 44 05/17/2021   LDLCALC 106 (H) 05/17/2021   TRIG 165 (H) 05/17/2021   CHOLHDL 4 11/21/2019    Medication review performed; medication list updated in electronic medical record. 09-25-2021: The patient endorses taking Crestor 10 mg three times a week. He is compliant and is hopeful this will bring his cholesterol level in range.  Provider established cholesterol goals reviewed, 03-01-2021: Reviewed the labwork and notes from the pcp on 02-08-2021. The patients LDL is 106 and goal is <70.  The patient does not want to take another pill but states if his pcp feels like this is something he needs to do he will do it. Recommended dietary changes as well to help with lowering of LDL.  Education and support given. The patient agrees to start the atorvastatin 10 mg and ask that it be sent in to [place] and he will pick up with  his other refills. In basket message sent to pcp with request after review of the pcp notes with the patient. Advised the patient to call with any questions or concerns.  03-05-2021: The patient called the RNCM and states he cannot take the new medication for cholesterol. He took it for 2 days and states he had throbbing pain behind his eyes and forehead both days  he took the medication. He took it for 2 days and it did this both days. The patient has stopped taking the medication and states he cannot function like this. The patient states he will work on his diet and try to get levels down by dietary measures. The patient states he had to go to bed for 3 or 4 hours just to function again. Blood pressures was 135/80 and all other vital signs were in normal limits. The patient talked about fixing his bacon in an air fryer and eating  quail eggs. Will notify the pcp of changes and the patient stopping the medication. 05-21-2021: The patient has a new script for Crestor (Rosuvastatin 10 mg) to take 3 times a week for his cholesterol. The patient has not picked it up yet but will when he gets in tomorrow. The patient states that he hopes he doesn't have issues like he did with the other medication. Education and support given. The patient knows to call the office for changes in his condition or problems with medications. 09-25-2021: The patient is taking Crestor as prescribed and the patient is also changing eating habits and eating healthier. The patient denies any acute changes in HLD health and feels with medications and changes his cholesterol levels with me better when checked again.  Counseled on importance of regular laboratory monitoring as prescribed; Provided HLD educational materials; Reviewed role and benefits of statin for ASCVD risk reduction; Discussed strategies to manage statin-induced myalgias; Reviewed importance of limiting foods high in cholesterol. 03-05-2021: Reviewed and gave suggestions for foods that support heart health and lower cholesterol. 09-25-2021: Reviewed foods that the patient has been eating. Praised for positive changes in food choices. The patient is staying away from pastas, breads and potatoes. He is also using a supplement called "bitter melon" that he says is really helping with his blood pressures and DM health and well being. ;  Screening for signs and symptoms of depression related to chronic disease state;  Assessed social determinant of health barriers;   Hypertension: (Status: Goal Met.) 12-11-2021: Goal met and plan of care completed. Closed  Last practice recorded BP readings:  BP Readings from Last 3 Encounters:  10/11/21 123/84  09/13/21 115/74  05/17/21 114/75  Reading when having issues with atorvastatin was 135/80 Most recent eGFR/CrCl:  Lab Results  Component Value Date   EGFR  86 10/10/2021    No components found for: CRCL  Evaluation of current treatment plan related to hypertension self management and patient's adherence to plan as established by provider. 09-25-2021: The patient has more stable blood pressures at this time. Denies any acute needs. States that he is taking his medications as prescribed. No new concerns with HTN management or heart health..   Provided education to patient re: stroke prevention, s/s of heart attack and stroke; Reviewed medications with patient and discussed importance of compliance. 09-25-2021: Is compliant with medications  Discussed plans with patient for ongoing care management follow up and provided patient with direct contact information for care management team; Advised patient, providing education and rationale, to monitor blood pressure daily and  record, calling PCP for findings outside established parameters;  Reviewed scheduled/upcoming provider appointments including, 10-11-2021 for follow up with the pcp   Provided education on prescribed diet heart healthy/ADA. 09-25-2021 Review with the patient- recommendations provided. Praised the patient for positive changes in dietary habits. He is eating better when he is out on the road and he is also eating better at home and following a keto diet with his wife when he is at home and not on the road  Discussed complications of poorly controlled blood pressure such as heart disease, stroke, circulatory complications, vision complications, kidney impairment, sexual dysfunction;  09-25-2021: States his eye condition has now resolved. Denies any new issues. Will continue to monitor for changes.   Patient Goals/Self-Care Activities: Patient will self administer medications as prescribed Patient will attend all scheduled provider appointments Patient will call pharmacy for medication refills Patient will attend church or other social activities Patient will continue to perform ADL's  independently Patient will continue to perform IADL's independently Patient will call provider office for new concerns or questions Patient will work with BSW to address care coordination needs and will continue to work with the clinical team to address health care and disease management related needs.        Plan: No further follow up required: patient has met goals for chronic disease management. Closing the plan of care. The patient has the RNCM number to call for new changes or needs  Noreene Larsson RN, MSN, [facility] Mobile: [phone]

## 2021-12-23 ENCOUNTER — Ambulatory Visit: Payer: Self-pay

## 2022-01-18 ENCOUNTER — Other Ambulatory Visit: Payer: Self-pay | Admitting: Nurse Practitioner

## 2022-01-20 NOTE — Telephone Encounter (Signed)
Requested medication (s) are due for refill today: expired medications  Requested medication (s) are on the active medication list: yes  Last refill:  norvasc - 01/03/21 #90 4 refills, metformin- 12/07/20 #360 4 refills  Future visit scheduled: yes in 4 months  Notes to clinic:  expired medications . Do you want to renew Rxs?     Requested Prescriptions  Pending Prescriptions Disp Refills   amLODipine (NORVASC) 10 MG tablet [Pharmacy Med Name: amLODIPine Besylate 10 MG Oral Tablet] 30 tablet 0    Sig: Take 1 tablet by mouth once daily     Cardiovascular: Calcium Channel Blockers 2 Passed - 01/18/2022  5:15 PM      Passed - Last BP in normal range    BP Readings from Last 1 Encounters:  10/11/21 123/84         Passed - Last Heart Rate in normal range    Pulse Readings from Last 1 Encounters:  10/11/21 64         Passed - Valid encounter within last 6 months    Recent Outpatient Visits           3 months ago Encounter for examination required by Department of Transportation (DOT)   [place], Vera T, NP   4 months ago Subconjunctival hemorrhage of right eye   [place], Jolene T, NP   8 months ago Type 2 diabetes mellitus with proteinuria ([place])   [place], Jolene T, NP   11 months ago Type 2 diabetes mellitus with proteinuria ([place])   [place], Jolene T, NP   1 year ago Type 2 diabetes mellitus with proteinuria ([place])   [place], [place] T, NP       Future Appointments             In 2 months Cannady, Barbaraann Faster, NP [employer], [facility]             metFORMIN (GLUCOPHAGE-XR) 500 MG 24 hr tablet [Pharmacy Med Name: metFORMIN HCl ER 500 MG Oral Tablet Extended Release 24 Hour] 120 tablet 0    Sig: Take 2 tablets by mouth twice daily     Endocrinology:  Diabetes - Biguanides Failed - 01/18/2022  5:15 PM      Failed - B12 Level in normal range  and within 720 days    No results found for: "VITAMINB12"       Passed - Cr in normal range and within 360 days    Creatinine, Ser  Date Value Ref Range Status  10/10/2021 1.04 0.76 - 1.27 mg/dL Final   Creatinine,U  Date Value Ref Range Status  11/21/2019 118.3 mg/dL Final         Passed - HBA1C is between 0 and 7.9 and within 180 days    HB A1C (BAYER DCA - WAIVED)  Date Value Ref Range Status  10/10/2021 6.3 (H) 4.8 - 5.6 % Final    Comment:             Prediabetes: 5.7 - 6.4          Diabetes: >6.4          Glycemic control for adults with diabetes: <7.0          Passed - eGFR in normal range and within 360 days    GFR calc Af Amer  Date Value Ref Range Status  09/24/2019 >60 >60 mL/min Final   GFR  calc non Af Amer  Date Value Ref Range Status  09/24/2019 >60 >60 mL/min Final   GFR  Date Value Ref Range Status  11/21/2019 68.44 >60.00 mL/min Final   eGFR  Date Value Ref Range Status  10/10/2021 86 >59 mL/min/1.73 Final         Passed - Valid encounter within last 6 months    Recent Outpatient Visits           3 months ago Encounter for examination required by Department of Transportation (DOT)   [facility] [place] T, NP   4 months ago Subconjunctival hemorrhage of right eye   [place], Jolene T, NP   8 months ago Type 2 diabetes mellitus with proteinuria ([place])   [place], Jolene T, NP   11 months ago Type 2 diabetes mellitus with proteinuria ([place])   [place], Jolene T, NP   1 year ago Type 2 diabetes mellitus with proteinuria ([place])   [place], [place]any T, NP       Future Appointments             In 2 months Cannady, [place] T, NP [employer], [facility]            Passed - CBC within normal limits and completed in the last 12 months    WBC  Date Value Ref Range Status  02/08/2021 4.2 3.4 - 10.8 x10E3/uL Final  11/21/2019  5.7 4.0 - 10.5 K/uL Final   RBC  Date Value Ref Range Status  02/08/2021 5.62 4.14 - 5.80 x10E6/uL Final  11/21/2019 5.34 4.22 - 5.81 Mil/uL Final   Hemoglobin  Date Value Ref Range Status  02/08/2021 16.8 13.0 - 17.7 g/dL Final   Hematocrit  Date Value Ref Range Status  02/08/2021 49.1 37.5 - 51.0 % Final   MCHC  Date Value Ref Range Status  02/08/2021 34.2 31.5 - 35.7 g/dL Final  11/21/2019 34.2 30.0 - 36.0 g/dL Final   [facility]  Date Value Ref Range Status  02/08/2021 29.9 26.6 - 33.0 pg Final  09/24/2019 29.6 26.0 - 34.0 pg Final   MCV  Date Value Ref Range Status  02/08/2021 87 79 - 97 fL Final   No results found for: "PLTCOUNTKUC", "LABPLAT", "POCPLA" RDW  Date Value Ref Range Status  02/08/2021 12.4 11.6 - 15.4 % Final

## 2022-02-17 ENCOUNTER — Ambulatory Visit: Payer: Self-pay

## 2022-02-17 NOTE — Patient Instructions (Signed)
Visit Information  Thank you for taking time to visit with me today. Please don't hesitate to contact me if I can be of assistance to you.   Following are the goals we discussed today:   Goals Addressed             This Visit's Progress    RNCM: "I cannot afford steglatro"       Care Coordination Interventions:  Lab Results  Component Value Date   HGBA1C 6.3 (H) 10/10/2021    Provided education to patient about basic DM disease process Reviewed medications with patient and discussed importance of medication adherence Counseled on importance of regular laboratory monitoring as prescribed Discussed plans with patient for ongoing care management follow up and provided patient with direct contact information for care management team Referral made to pharmacy team for assistance with questions related to how to get Steglatro at a discount; otherwise he cannot afford.  Review of patient status, including review of consultants reports, relevant laboratory and other test results, and medications completed Screening for signs and symptoms of depression related to chronic disease state  Assessed social determinant of health barriers 02-17-2022: The patient states that he has a 30 day supply of Steglatro but he will not be able to continue taking unless he gets a coupon. He has had a coupon before but could not remember how. While talking to the patient he remember that his sister in law who works at a pharmacy assisted him with this. Education provided to go to the website and call the number on the form and inquire about this. The patient states he will. Pharm D for [facility] is on vacation this week therefore unable to get assistance from the pharm D. Education and support given. The patient will call the number and reach out for further assistance if needed. Will alert the pcp of the information. Currently the patient has a 30 day supply of medications.             Please call the care guide  team at [phone] if you need to  schedule an appointment.   If you are experiencing a Mental Health or Behavioral Health Crisis or need someone to talk to, please call the Suicide and Crisis Lifeline: 988 call the [place] National Suicide Prevention Lifeline: 4692128924 or TTY: 531-148-0778 TTY 989-065-7314) to talk to a trained counselor call 1-800-273-TALK (toll free, 24 hour hotline)  Patient verbalizes understanding of instructions and care plan provided today and agrees to view in MyChart. Active MyChart status and patient understanding of how to access instructions and care plan via MyChart confirmed with patient.     No further follow up required: the patient had a medication question and RNCM provided information. Will alert the pcp of the needs of the patient.   Alto Denver RN, MSN, CCM Community Care Coordinator [facility]  [facility] Mobile: [phone]

## 2022-02-17 NOTE — Patient Outreach (Signed)
  Care Coordination   Follow Up Visit Note   02/17/2022 Name: Joseph Burch MRN: [id number] DOB: [birth year]  Joseph Burch is a 51 y.o. year old male who sees [name], Corrie Dandy T, NP for primary care. I spoke with  Joseph Burch by phone today  What matters to the patients health and wellness today?  I need help getting steglatro because I cannot afford it unless I have a coupon    Goals Addressed             This Visit's Progress    RNCM: "I cannot afford steglatro"       Care Coordination Interventions:  Lab Results  Component Value Date   HGBA1C 6.3 (H) 10/10/2021    Provided education to patient about basic DM disease process Reviewed medications with patient and discussed importance of medication adherence Counseled on importance of regular laboratory monitoring as prescribed Discussed plans with patient for ongoing care management follow up and provided patient with direct contact information for care management team Referral made to pharmacy team for assistance with questions related to how to get Steglatro at a discount; otherwise he cannot afford.  Review of patient status, including review of consultants reports, relevant laboratory and other test results, and medications completed Screening for signs and symptoms of depression related to chronic disease state  Assessed social determinant of health barriers 02-17-2022: The patient states that he has a 30 day supply of Steglatro but he will not be able to continue taking unless he gets a coupon. He has had a coupon before but could not remember how. While talking to the patient he remember that his sister in law who works at a pharmacy assisted him with this. Education provided to go to the website and call the number on the form and inquire about this. The patient states he will. Pharm D for [facility] is on vacation this week therefore unable to get assistance from the pharm D. Education and support given. The patient will call the  number and reach out for further assistance if needed. Will alert the pcp of the information. Currently the patient has a 30 day supply of medications.           SDOH assessments and interventions completed:  Yes  SDOH Interventions Today    Flowsheet Row Most Recent Value  SDOH Interventions   Food Insecurity Interventions Intervention Not Indicated  Housing Interventions Intervention Not Indicated  Transportation Interventions Intervention Not Indicated        Care Coordination Interventions Activated:  Yes  Care Coordination Interventions:  Yes, provided   Follow up plan: No further intervention required.   Encounter Outcome:  Pt. Visit Completed   Joseph Denver RN, MSN, CCM Community Care Coordinator [facility]  [facility] Mobile: [phone]

## 2022-04-06 NOTE — Patient Instructions (Signed)
Diabetes Mellitus Basics  Diabetes mellitus, or diabetes, is a long-term (chronic) disease. It occurs when the body does not properly use sugar (glucose) that is released from food after you eat. Diabetes mellitus may be caused by one or both of these problems: Your pancreas does not make enough of a hormone called insulin. Your body does not react in a normal way to the insulin that it makes. Insulin lets glucose enter cells in your body. This gives you energy. If you have diabetes, glucose cannot get into cells. This causes high blood glucose (hyperglycemia). How to treat and manage diabetes You may need to take insulin or other diabetes medicines daily to keep your glucose in balance. If you are prescribed insulin, you will learn how to give yourself insulin by injection. You may need to adjust the amount of insulin you take based on the foods that you eat. You will need to check your blood glucose levels using a glucose monitor as told by your health care provider. The readings can help determine if you have low or high blood glucose. Generally, you should have these blood glucose levels: Before meals (preprandial): 80-130 mg/dL (4.4-7.2 mmol/L). After meals (postprandial): below 180 mg/dL (10 mmol/L). Hemoglobin A1c (HbA1c) level: less than 7%. Your health care provider will set treatment goals for you. Keep all follow-up visits. This is important. Follow these instructions at home: Diabetes medicines Take your diabetes medicines every day as told by your health care provider. List your diabetes medicines here: Name of medicine: ______________________________ Amount (dose): _______________ Time (a.m./p.m.): _______________ Notes: ___________________________________ Name of medicine: ______________________________ Amount (dose): _______________ Time (a.m./p.m.): _______________ Notes: ___________________________________ Name of medicine: ______________________________ Amount (dose):  _______________ Time (a.m./p.m.): _______________ Notes: ___________________________________ Insulin If you use insulin, list the types of insulin you use here: Insulin type: ______________________________ Amount (dose): _______________ Time (a.m./p.m.): _______________Notes: ___________________________________ Insulin type: ______________________________ Amount (dose): _______________ Time (a.m./p.m.): _______________ Notes: ___________________________________ Insulin type: ______________________________ Amount (dose): _______________ Time (a.m./p.m.): _______________ Notes: ___________________________________ Insulin type: ______________________________ Amount (dose): _______________ Time (a.m./p.m.): _______________ Notes: ___________________________________ Insulin type: ______________________________ Amount (dose): _______________ Time (a.m./p.m.): _______________ Notes: ___________________________________ Managing blood glucose  Check your blood glucose levels using a glucose monitor as told by your health care provider. Write down the times that you check your glucose levels here: Time: _______________ Notes: ___________________________________ Time: _______________ Notes: ___________________________________ Time: _______________ Notes: ___________________________________ Time: _______________ Notes: ___________________________________ Time: _______________ Notes: ___________________________________ Time: _______________ Notes: ___________________________________  Low blood glucose Low blood glucose (hypoglycemia) is when glucose is at or below 70 mg/dL (3.9 mmol/L). Symptoms may include: Feeling: Hungry. Sweaty and clammy. Irritable or easily upset. Dizzy. Sleepy. Having: A fast heartbeat. A headache. A change in your vision. Numbness around the mouth, lips, or tongue. Having trouble with: Moving (coordination). Sleeping. Treating low blood glucose To treat low blood  glucose, eat or drink something containing sugar right away. If you can think clearly and swallow safely, follow the 15:15 rule: Take 15 grams of a fast-acting carb (carbohydrate), as told by your health care provider. Some fast-acting carbs are: Glucose tablets: take 3-4 tablets. Hard candy: eat 3-5 pieces. Fruit juice: drink 4 oz (120 mL). Regular (not diet) soda: drink 4-6 oz (120-180 mL). Honey or sugar: eat 1 Tbsp (15 mL). Check your blood glucose levels 15 minutes after you take the carb. If your glucose is still at or below 70 mg/dL (3.9 mmol/L), take 15 grams of a carb again. If your glucose does not go above 70 mg/dL (3.9 mmol/L) after   3 tries, get help right away. After your glucose goes back to normal, eat a meal or a snack within 1 hour. Treating very low blood glucose If your glucose is at or below 54 mg/dL (3 mmol/L), you have very low blood glucose (severe hypoglycemia). This is an emergency. Do not wait to see if the symptoms will go away. Get medical help right away. Call your local emergency services (911 in the [place] Do not drive yourself to the hospital. Questions to ask your health care provider Should I talk with a diabetes educator? What equipment will I need to care for myself at home? What diabetes medicines do I need? When should I take them? How often do I need to check my blood glucose levels? What number can I call if I have questions? When is my follow-up visit? Where can I find a support group for people with diabetes? Where to find more information American Diabetes Association: www.diabetes.org Association of Diabetes Care and Education Specialists: www.diabeteseducator.org Contact a health care provider if: Your blood glucose is at or above 240 mg/dL (13.3 mmol/L) for 2 days in a row. You have been sick or have had a fever for 2 days or more, and you are not getting better. You have any of these problems for more than 6 hours: You cannot eat or  drink. You feel nauseous. You vomit. You have diarrhea. Get help right away if: Your blood glucose is lower than 54 mg/dL (3 mmol/L). You get confused. You have trouble thinking clearly. You have trouble breathing. These symptoms may represent a serious problem that is an emergency. Do not wait to see if the symptoms will go away. Get medical help right away. Call your local emergency services (911 in the [place] Do not drive yourself to the hospital. Summary Diabetes mellitus is a chronic disease that occurs when the body does not properly use sugar (glucose) that is released from food after you eat. Take insulin and diabetes medicines as told. Check your blood glucose every day, as often as told. Keep all follow-up visits. This is important. This information is not intended to replace advice given to you by your health care provider. Make sure you discuss any questions you have with your health care provider. Document Revised: 10/18/2019 Document Reviewed: 10/18/2019 Elsevier Patient Education  2023 Elsevier Inc.  

## 2022-04-11 ENCOUNTER — Ambulatory Visit (INDEPENDENT_AMBULATORY_CARE_PROVIDER_SITE_OTHER): Payer: Self-pay | Admitting: Nurse Practitioner

## 2022-04-11 ENCOUNTER — Encounter: Payer: Self-pay | Admitting: Nurse Practitioner

## 2022-04-11 VITALS — BP 116/68 | HR 56 | Temp 97.9°F | Ht 66.25 in | Wt 169.1 lb

## 2022-04-11 DIAGNOSIS — I152 Hypertension secondary to endocrine disorders: Secondary | ICD-10-CM

## 2022-04-11 DIAGNOSIS — E1159 Type 2 diabetes mellitus with other circulatory complications: Secondary | ICD-10-CM | POA: Diagnosis not present

## 2022-04-11 DIAGNOSIS — R809 Proteinuria, unspecified: Secondary | ICD-10-CM

## 2022-04-11 DIAGNOSIS — E1169 Type 2 diabetes mellitus with other specified complication: Secondary | ICD-10-CM | POA: Diagnosis not present

## 2022-04-11 DIAGNOSIS — Z1159 Encounter for screening for other viral diseases: Secondary | ICD-10-CM | POA: Diagnosis not present

## 2022-04-11 DIAGNOSIS — E1129 Type 2 diabetes mellitus with other diabetic kidney complication: Secondary | ICD-10-CM

## 2022-04-11 DIAGNOSIS — Z6829 Body mass index (BMI) 29.0-29.9, adult: Secondary | ICD-10-CM

## 2022-04-11 DIAGNOSIS — N400 Enlarged prostate without lower urinary tract symptoms: Secondary | ICD-10-CM | POA: Diagnosis not present

## 2022-04-11 DIAGNOSIS — E785 Hyperlipidemia, unspecified: Secondary | ICD-10-CM | POA: Diagnosis not present

## 2022-04-11 DIAGNOSIS — Z6827 Body mass index (BMI) 27.0-27.9, adult: Secondary | ICD-10-CM

## 2022-04-11 LAB — BAYER DCA HB A1C WAIVED: HB A1C (BAYER DCA - WAIVED): 6.4 % — ABNORMAL HIGH (ref 4.8–5.6)

## 2022-04-11 NOTE — Progress Notes (Signed)
BP 116/68 (BP Location: Left Arm, Patient Position: Sitting, Cuff Size: Normal)   Pulse (!) 56   Temp 97.9 F (36.6 C) (Oral)   Ht 5' 6.25" (1.683 m)   Wt 169 lb 1.6 oz (76.7 kg)   SpO2 98%   BMI 27.09 kg/m    Subjective:    Patient ID: Joseph Burch, male    DOB: Mar 12, 1971, 51 y.o.   MRN: [id number]  HPI: Joseph Burch is a 51 y.o. male  Chief Complaint  Patient presents with   Diabetes   Hyperlipidemia   Hypertension   DIABETES Last A1c was 6.3% April.  Continues on Steglatro 5 MG daily and taking Metformin XR 1000 MG BID.   Hypoglycemic episodes:no Polydipsia/polyuria: no Visual disturbance: no Chest pain: no Paresthesias: no Glucose Monitoring: no             Accucheck frequency: not checking             Fasting glucose:              Post prandial:             Evening:             Before meals: Taking Insulin?: no             Long acting insulin:             Short acting insulin: Blood Pressure Monitoring: a few times a week Retinal Examination: Up To Date -- [facility] Exam: Up to Date Pneumovax: refuses Influenza: refuses Aspirin: yes    HYPERTENSION / HYPERLIPIDEMIA Continues on Amlodipine 10 MG. Tried Atorvastatin in past which caused a splitting headache.  Currently taking Crestor without issue.  Has taken Losartan in past -- made him feel loopy and did not tolerate.  Is a past smoker, quit >9 years ago -- smoked 1 to 1/2 PPD.   Satisfied with current treatment? yes Duration of hypertension: chronic BP monitoring frequency: a few times a week BP range: 110-115/70-75 BP medication side effects: no Duration of hyperlipidemia: chronic Aspirin: no Recent stressors: no Recurrent headaches: no Visual changes: no Palpitations: no Dyspnea: no Chest pain: no Lower extremity edema: no Dizzy/lightheaded: no  The ASCVD Risk score (Arnett DK, et al., 2019) failed to calculate for the following reasons:   The valid total cholesterol range is 130  to 320 mg/dL   Relevant past medical, surgical, family and social history reviewed and updated as indicated. Interim medical history since our last visit reviewed. Allergies and medications reviewed and updated.  Review of Systems  Constitutional:  Negative for activity change, diaphoresis, fatigue and fever.  Respiratory:  Negative for cough, chest tightness, shortness of breath and wheezing.   Cardiovascular:  Negative for chest pain, palpitations and leg swelling.  Gastrointestinal: Negative.   Endocrine: Negative for cold intolerance, heat intolerance, polydipsia, polyphagia and polyuria.  Neurological: Negative.   Psychiatric/Behavioral: Negative.      Per HPI unless specifically indicated above     Objective:    BP 116/68 (BP Location: Left Arm, Patient Position: Sitting, Cuff Size: Normal)   Pulse (!) 56   Temp 97.9 F (36.6 C) (Oral)   Ht 5' 6.25" (1.683 m)   Wt 169 lb 1.6 oz (76.7 kg)   SpO2 98%   BMI 27.09 kg/m   Wt Readings from Last 3 Encounters:  04/11/22 169 lb 1.6 oz (76.7 kg)  10/11/21 170 lb 3.2 oz (77.2 kg)  09/13/21  171 lb 9.6 oz (77.8 kg)    Physical Exam Vitals and nursing note reviewed.  Constitutional:      General: He is awake. He is not in acute distress.    Appearance: He is well-developed and well-groomed. He is not ill-appearing or toxic-appearing.  HENT:     Head: Normocephalic and atraumatic.     Right Ear: Hearing, tympanic membrane, ear canal and external ear normal. No drainage.     Left Ear: Hearing, tympanic membrane, ear canal and external ear normal. No drainage.  Eyes:     General: Lids are normal.        Right eye: No discharge.        Left eye: No discharge.     Conjunctiva/sclera: Conjunctivae normal.     Pupils: Pupils are equal, round, and reactive to light.  Neck:     Thyroid: No thyromegaly.     Vascular: No carotid bruit.  Cardiovascular:     Rate and Rhythm: Normal rate and regular rhythm.     Heart sounds: Normal  heart sounds, S1 normal and S2 normal. No murmur heard.    No gallop.  Pulmonary:     Effort: Pulmonary effort is normal. No accessory muscle usage or respiratory distress.     Breath sounds: Normal breath sounds.  Abdominal:     General: Bowel sounds are normal.     Palpations: Abdomen is soft. There is no hepatomegaly or splenomegaly.  Musculoskeletal:        General: Normal range of motion.     Cervical back: Normal range of motion and neck supple.     Right lower leg: No edema.     Left lower leg: No edema.  Lymphadenopathy:     Cervical: No cervical adenopathy.  Skin:    General: Skin is warm and dry.     Capillary Refill: Capillary refill takes less than 2 seconds.     Findings: No rash.  Neurological:     Mental Status: He is alert and oriented to person, place, and time.     Deep Tendon Reflexes: Reflexes are normal and symmetric.  Psychiatric:        Attention and Perception: Attention normal.        Mood and Affect: Mood normal.        Speech: Speech normal.        Behavior: Behavior normal. Behavior is cooperative.        Thought Content: Thought content normal.    Results for orders placed or performed in visit on 11/20/21  HM DIABETES EYE EXAM  Result Value Ref Range   HM Diabetic Eye Exam No Retinopathy No Retinopathy      Assessment & Plan:   Problem List Items Addressed This Visit       Cardiovascular and Mediastinum   Hypertension associated with diabetes (HCC)    Chronic, stable.  BP at goal in office today.  Did not tolerate ARB in past, although would benefit from ACE or ARB with his diabetes and urine ALB 27 October 2021, consider low dose ACE trial in future.  Recommend he monitor BP at least a few mornings a week at home and document.  DASH diet at home.  Continue current medication regimen and adjust as needed.  Labs: BMP and TSH.         Relevant Orders   Bayer DCA Hb A1c Waived   Lipid Panel w/o Chol/HDL Ratio     Endocrine  Hyperlipidemia  associated with type 2 diabetes mellitus (HCC)    Chronic, ongoing.  Continue current medication regimen and adjust as needed.  Lipid panel today.      Relevant Orders   Bayer DCA Hb A1c Waived   Basic metabolic panel   TSH   Type 2 diabetes mellitus with proteinuria (HCC) - Primary    Chronic, ongoing.   A1c today 6.4%, remains stable from initial 9.3%. Praised for success. - At this time will continue Metformin XR 1000 MG BID and Steglatro 5 MG as offering benefit - CCM assistance in place. - Recommend he monitor BS 2-3 times daily with goal fasting <130 and goal 2 hours post meal <180.  Document and bring to visits.   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      Relevant Orders   Bayer DCA Hb A1c Waived   Basic metabolic panel     Other   BMI 27.0-27.9,adult    BMI 27.09.  Recommended eating smaller high protein, low fat meals more frequently and exercising 30 mins a day 5 times a week with a goal of 10-15lb weight loss in the next 3 months. Patient voiced their understanding and motivation to adhere to these recommendations.       Other Visit Diagnoses     Benign prostatic hyperplasia without lower urinary tract symptoms       PSA on labs today.   Relevant Orders   PSA   Need for hepatitis C screening test       Hep C screen on labs today per guidelines for one time screening, discussed with patient.   Relevant Orders   Hepatitis C antibody        Follow up plan: Return in about 6 months (around 10/11/2022) for T2DM, HTN/HLD -- DOT?Marland Kitchen

## 2022-04-11 NOTE — Assessment & Plan Note (Signed)
Chronic, stable.  BP at goal in office today.  Did not tolerate ARB in past, although would benefit from ACE or ARB with his diabetes and urine ALB 27 October 2021, consider low dose ACE trial in future.  Recommend he monitor BP at least a few mornings a week at home and document.  DASH diet at home.  Continue current medication regimen and adjust as needed.  Labs: BMP and TSH.

## 2022-04-11 NOTE — Assessment & Plan Note (Signed)
BMI 27.09.  Recommended eating smaller high protein, low fat meals more frequently and exercising 30 mins a day 5 times a week with a goal of 10-15lb weight loss in the next 3 months. Patient voiced their understanding and motivation to adhere to these recommendations.

## 2022-04-11 NOTE — Assessment & Plan Note (Signed)
Chronic, ongoing.  Continue current medication regimen and adjust as needed. Lipid panel today. 

## 2022-04-11 NOTE — Assessment & Plan Note (Signed)
Chronic, ongoing.   A1c today 6.4%, remains stable from initial 9.3%. Praised for success. - At this time will continue Metformin XR 1000 MG BID and Steglatro 5 MG as offering benefit - CCM assistance in place. - Recommend he monitor BS 2-3 times daily with goal fasting <130 and goal 2 hours post meal <180.  Document and bring to visits.   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

## 2022-04-12 LAB — BASIC METABOLIC PANEL
BUN/Creatinine Ratio: 17 (ref 9–20)
BUN: 18 mg/dL (ref 6–24)
CO2: 22 mmol/L (ref 20–29)
Calcium: 9.7 mg/dL (ref 8.7–10.2)
Chloride: 101 mmol/L (ref 96–106)
Creatinine, Ser: 1.08 mg/dL (ref 0.76–1.27)
Glucose: 144 mg/dL — ABNORMAL HIGH (ref 70–99)
Potassium: 4.6 mmol/L (ref 3.5–5.2)
Sodium: 142 mmol/L (ref 134–144)
eGFR: 82 mL/min/{1.73_m2} (ref >59)

## 2022-04-12 LAB — PSA: Prostate Specific Ag, Serum: 0.5 ng/mL (ref 0.0–4.0)

## 2022-04-12 LAB — LIPID PANEL W/O CHOL/HDL RATIO
Cholesterol, Total: 121 mg/dL (ref 100–199)
HDL: 44 mg/dL (ref >39)
LDL Chol Calc (NIH): 57 mg/dL (ref 0–99)
Triglycerides: 109 mg/dL (ref 0–149)
VLDL Cholesterol Cal: 20 mg/dL (ref 5–40)

## 2022-04-12 LAB — HEPATITIS C ANTIBODY: Hep C Virus Ab: NONREACTIVE

## 2022-04-12 LAB — TSH: TSH: 1.69 u[IU]/mL (ref 0.450–4.500)

## 2022-04-12 NOTE — Progress Notes (Signed)
Contacted via [place] afternoon Joseph Burch, all of your labs have returned: - Kidney function, creatinine and eGFR, remains normal, as is liver function, AST and ALT.  - Cholesterol levels at goal -- would recommend that Joseph Burch 3 days a week to maintain. - Thyroid and prostate labs are normal.  Hep C screening, which we perform on all adults across the board, is negative.  Any questions? Keep being awesome!!  Thank you for allowing me to participate in your care.  I appreciate you. Kindest regards, Zyan Coby

## 2022-04-19 ENCOUNTER — Other Ambulatory Visit: Payer: Self-pay | Admitting: Nurse Practitioner

## 2022-04-21 NOTE — Telephone Encounter (Signed)
Requested Prescriptions  Pending Prescriptions Disp Refills  . amLODipine (NORVASC) 10 MG tablet [Pharmacy Med Name: amLODIPine Besylate 10 MG Oral Tablet] 90 tablet 1    Sig: Take 1 tablet by mouth once daily     Cardiovascular: Calcium Channel Blockers 2 Passed - 04/19/2022 11:08 AM      Passed - Last BP in normal range    BP Readings from Last 1 Encounters:  04/11/22 116/68         Passed - Last Heart Rate in normal range    Pulse Readings from Last 1 Encounters:  04/11/22 (!) 56         Passed - Valid encounter within last 6 months    Recent Outpatient Visits          1 week ago Type 2 diabetes mellitus with proteinuria ([place])   [place], Zavalla T, NP   6 months ago Encounter for examination required by Department of Transportation (DOT)   [employer], Glyndon T, NP   7 months ago Subconjunctival hemorrhage of right eye   [place], Jolene T, NP   11 months ago Type 2 diabetes mellitus with proteinuria ([place])   [place], Jolene T, NP   1 year ago Type 2 diabetes mellitus with proteinuria ([place])   [facility], Henrine Screws T, NP      Future Appointments            In 5 months Cannady, Barbaraann Faster, NP [facility]           . metFORMIN (GLUCOPHAGE-XR) 500 MG 24 hr tablet [Pharmacy Med Name: metFORMIN HCl ER 500 MG Oral Tablet Extended Release 24 Hour] 360 tablet 0    Sig: Take 2 tablets by mouth twice daily     Endocrinology:  Diabetes - Biguanides Failed - 04/19/2022 11:08 AM      Failed - B12 Level in normal range and within 720 days    No results found for: "VITAMINB12"       Failed - CBC within normal limits and completed in the last 12 months    WBC  Date Value Ref Range Status  02/08/2021 4.2 3.4 - 10.8 x10E3/uL Final  11/21/2019 5.7 4.0 - 10.5 K/uL Final   RBC  Date Value Ref Range Status  02/08/2021 5.62 4.14 - 5.80 x10E6/uL Final   11/21/2019 5.34 4.22 - 5.81 Mil/uL Final   Hemoglobin  Date Value Ref Range Status  02/08/2021 16.8 13.0 - 17.7 g/dL Final   Hematocrit  Date Value Ref Range Status  02/08/2021 49.1 37.5 - 51.0 % Final   MCHC  Date Value Ref Range Status  02/08/2021 34.2 31.5 - 35.7 g/dL Final  11/21/2019 34.2 30.0 - 36.0 g/dL Final   [facility]  Date Value Ref Range Status  02/08/2021 29.9 26.6 - 33.0 pg Final  09/24/2019 29.6 26.0 - 34.0 pg Final   MCV  Date Value Ref Range Status  02/08/2021 87 79 - 97 fL Final   No results found for: "PLTCOUNTKUC", "LABPLAT", "POCPLA" RDW  Date Value Ref Range Status  02/08/2021 12.4 11.6 - 15.4 % Final         Passed - Cr in normal range and within 360 days    Creatinine, Ser  Date Value Ref Range Status  04/11/2022 1.08 0.76 - 1.27 mg/dL Final   Creatinine,U  Date Value Ref Range Status  11/21/2019 118.3 mg/dL Final  Passed - HBA1C is between 0 and 7.9 and within 180 days    HB A1C (BAYER DCA - WAIVED)  Date Value Ref Range Status  04/11/2022 6.4 (H) 4.8 - 5.6 % Final    Comment:             Prediabetes: 5.7 - 6.4          Diabetes: >6.4          Glycemic control for adults with diabetes: <7.0          Passed - eGFR in normal range and within 360 days    GFR calc Af Amer  Date Value Ref Range Status  09/24/2019 >60 >60 mL/min Final   GFR calc non Af Amer  Date Value Ref Range Status  09/24/2019 >60 >60 mL/min Final   GFR  Date Value Ref Range Status  11/21/2019 68.44 >60.00 mL/min Final   eGFR  Date Value Ref Range Status  04/11/2022 82 >59 mL/min/1.73 Final         Passed - Valid encounter within last 6 months    Recent Outpatient Visits          1 week ago Type 2 diabetes mellitus with proteinuria ([place])   [place], Barbaraann Faster, NP   6 months ago Encounter for examination required by Department of Transportation (DOT)   [facility] [place], Rosebud T, NP   7 months ago  Subconjunctival hemorrhage of right eye   [place], Jolene T, NP   11 months ago Type 2 diabetes mellitus with proteinuria ([place])   [place], Jolene T, NP   1 year ago Type 2 diabetes mellitus with proteinuria ([place])   [place], Barbaraann Faster, NP      Future Appointments            In 5 months Cannady, Barbaraann Faster, NP [facility], [facility]

## 2022-04-21 NOTE — Telephone Encounter (Signed)
Requested medication (s) are due for refill today: yes  Requested medication (s) are on the active medication list: yes  Last refill:  01/21/22 #360 with 0 RF  Future visit scheduled: 09/26/22, seen 04/11/22  Notes to clinic:  Failed protocol of labs within 12 months, CBC from 02/08/21, has upcoming appt, please assess.       Requested Prescriptions  Pending Prescriptions Disp Refills   metFORMIN (GLUCOPHAGE-XR) 500 MG 24 hr tablet [Pharmacy Med Name: metFORMIN HCl ER 500 MG Oral Tablet Extended Release 24 Hour] 360 tablet 0    Sig: Take 2 tablets by mouth twice daily     Endocrinology:  Diabetes - Biguanides Failed - 04/19/2022 11:08 AM      Failed - B12 Level in normal range and within 720 days    No results found for: "VITAMINB12"       Failed - CBC within normal limits and completed in the last 12 months    WBC  Date Value Ref Range Status  02/08/2021 4.2 3.4 - 10.8 x10E3/uL Final  11/21/2019 5.7 4.0 - 10.5 K/uL Final   RBC  Date Value Ref Range Status  02/08/2021 5.62 4.14 - 5.80 x10E6/uL Final  11/21/2019 5.34 4.22 - 5.81 Mil/uL Final   Hemoglobin  Date Value Ref Range Status  02/08/2021 16.8 13.0 - 17.7 g/dL Final   Hematocrit  Date Value Ref Range Status  02/08/2021 49.1 37.5 - 51.0 % Final   MCHC  Date Value Ref Range Status  02/08/2021 34.2 31.5 - 35.7 g/dL Final  11/21/2019 34.2 30.0 - 36.0 g/dL Final   [facility]  Date Value Ref Range Status  02/08/2021 29.9 26.6 - 33.0 pg Final  09/24/2019 29.6 26.0 - 34.0 pg Final   MCV  Date Value Ref Range Status  02/08/2021 87 79 - 97 fL Final   No results found for: "PLTCOUNTKUC", "LABPLAT", "POCPLA" RDW  Date Value Ref Range Status  02/08/2021 12.4 11.6 - 15.4 % Final         Passed - Cr in normal range and within 360 days    Creatinine, Ser  Date Value Ref Range Status  04/11/2022 1.08 0.76 - 1.27 mg/dL Final   Creatinine,U  Date Value Ref Range Status  11/21/2019 118.3 mg/dL Final         Passed -  HBA1C is between 0 and 7.9 and within 180 days    HB A1C (BAYER DCA - WAIVED)  Date Value Ref Range Status  04/11/2022 6.4 (H) 4.8 - 5.6 % Final    Comment:             Prediabetes: 5.7 - 6.4          Diabetes: >6.4          Glycemic control for adults with diabetes: <7.0          Passed - eGFR in normal range and within 360 days    GFR calc Af Amer  Date Value Ref Range Status  09/24/2019 >60 >60 mL/min Final   GFR calc non Af Amer  Date Value Ref Range Status  09/24/2019 >60 >60 mL/min Final   GFR  Date Value Ref Range Status  11/21/2019 68.44 >60.00 mL/min Final   eGFR  Date Value Ref Range Status  04/11/2022 82 >59 mL/min/1.73 Final         Passed - Valid encounter within last 6 months    Recent Outpatient Visits  1 week ago Type 2 diabetes mellitus with proteinuria ([place])   [place], Barbaraann Faster, NP   6 months ago Encounter for examination required by Department of Transportation (DOT)   [facility] [place], [place] T, NP   7 months ago Subconjunctival hemorrhage of right eye   [place], Jolene T, NP   11 months ago Type 2 diabetes mellitus with proteinuria ([place])   [place], Jolene T, NP   1 year ago Type 2 diabetes mellitus with proteinuria ([place])   [place], Henrine Screws T, NP       Future Appointments             In 5 months Cannady, Barbaraann Faster, NP [facility]            Signed Prescriptions Disp Refills   amLODipine (NORVASC) 10 MG tablet 90 tablet 1    Sig: Take 1 tablet by mouth once daily     Cardiovascular: Calcium Channel Blockers 2 Passed - 04/19/2022 11:08 AM      Passed - Last BP in normal range    BP Readings from Last 1 Encounters:  04/11/22 116/68         Passed - Last Heart Rate in normal range    Pulse Readings from Last 1 Encounters:  04/11/22 (!) 47         Passed - Valid encounter within last 6 months     Recent Outpatient Visits           1 week ago Type 2 diabetes mellitus with proteinuria ([place])   [place], Henrine Screws T, NP   6 months ago Encounter for examination required by Department of Transportation (DOT)   [facility] [place], [place] T, NP   7 months ago Subconjunctival hemorrhage of right eye   [place], Jolene T, NP   11 months ago Type 2 diabetes mellitus with proteinuria ([place])   [place], Jolene T, NP   1 year ago Type 2 diabetes mellitus with proteinuria ([place])   [place], Barbaraann Faster, NP       Future Appointments             In 5 months Cannady, Barbaraann Faster, NP [facility]

## 2022-05-13 ENCOUNTER — Other Ambulatory Visit: Payer: Self-pay | Admitting: Nurse Practitioner

## 2022-05-13 NOTE — Telephone Encounter (Signed)
Requested Prescriptions  Pending Prescriptions Disp Refills   STEGLATRO 5 MG TABS tablet [Pharmacy Med Name: Steglatro 5 MG Oral Tablet] 90 tablet 1    Sig: Take 1 tablet by mouth once daily     Endocrinology: Diabetes - SGLT2 Inhibitors - ertugliflozin Passed - 05/13/2022  3:45 PM      Passed - Cr in normal range and within 360 days    Creatinine, Ser  Date Value Ref Range Status  04/11/2022 1.08 0.76 - 1.27 mg/dL Final   Creatinine,U  Date Value Ref Range Status  11/21/2019 118.3 mg/dL Final         Passed - HBA1C is between 0 and 7.9 and within 180 days    HB A1C (BAYER DCA - WAIVED)  Date Value Ref Range Status  04/11/2022 6.4 (H) 4.8 - 5.6 % Final    Comment:             Prediabetes: 5.7 - 6.4          Diabetes: >6.4          Glycemic control for adults with diabetes: <7.0          Passed - eGFR is 40 or above and within 360 days    GFR calc Af Amer  Date Value Ref Range Status  09/24/2019 >60 >60 mL/min Final   GFR calc non Af Amer  Date Value Ref Range Status  09/24/2019 >60 >60 mL/min Final   GFR  Date Value Ref Range Status  11/21/2019 68.44 >60.00 mL/min Final   eGFR  Date Value Ref Range Status  04/11/2022 82 >59 mL/min/1.73 Final         Passed - Valid encounter within last 6 months    Recent Outpatient Visits           1 month ago Type 2 diabetes mellitus with proteinuria ([place])   [place], Barbaraann Faster, NP   7 months ago Encounter for examination required by Department of Transportation (DOT)   [employer], [place] T, NP   8 months ago Subconjunctival hemorrhage of right eye   [place], [place] T, NP   12 months ago Type 2 diabetes mellitus with proteinuria ([place])   [place], Jolene T, NP   1 year ago Type 2 diabetes mellitus with proteinuria ([place])   [place], Barbaraann Faster, NP       Future Appointments             In 4 months Cannady,  Barbaraann Faster, NP [facility]

## 2022-06-18 ENCOUNTER — Other Ambulatory Visit: Payer: Self-pay | Admitting: Nurse Practitioner

## 2022-06-18 NOTE — Telephone Encounter (Signed)
Requested medication (s) are due for refill today: expired medication  Requested medication (s) are on the active medication list: yes   Last refill:   05/19/21 #48 4 refills  Future visit scheduled: yes in 3 months  Notes to clinic:  expired medication . Do you want to renew Rx?     Requested Prescriptions  Pending Prescriptions Disp Refills   rosuvastatin (CRESTOR) 10 MG tablet [Pharmacy Med Name: Rosuvastatin Calcium 10 MG Oral Tablet] 12 tablet 0    Sig: TAKE ONE TABLET BY MOUTH THREE DAYS A WEEK; MONDAY, WEDNESDAY, FRIDAY     Cardiovascular:  Antilipid - Statins 2 Failed - 06/18/2022  9:21 AM      Failed - Lipid Panel in normal range within the last 12 months    Cholesterol, Total  Date Value Ref Range Status  04/11/2022 121 100 - 199 mg/dL Final   LDL Chol Calc (NIH)  Date Value Ref Range Status  04/11/2022 57 0 - 99 mg/dL Final   HDL  Date Value Ref Range Status  04/11/2022 44 >39 mg/dL Final   Triglycerides  Date Value Ref Range Status  04/11/2022 109 0 - 149 mg/dL Final         Passed - Cr in normal range and within 360 days    Creatinine, Ser  Date Value Ref Range Status  04/11/2022 1.08 0.76 - 1.27 mg/dL Final   Creatinine,U  Date Value Ref Range Status  11/21/2019 118.3 mg/dL Final         Passed - Patient is not pregnant      Passed - Valid encounter within last 12 months    Recent Outpatient Visits           2 months ago Type 2 diabetes mellitus with proteinuria (HCC)   [facility] Cannady, Dorie Rank, NP   8 months ago Encounter for examination required by Department of Transportation (DOT)   [facility], [place] T, NP   9 months ago Subconjunctival hemorrhage of right eye   [facility] [place], [place] T, NP   1 year ago Type 2 diabetes mellitus with proteinuria (HCC)   [facility] [place], Jolene T, NP   1 year ago Type 2 diabetes mellitus with proteinuria (HCC)   [facility] [place], Dorie Rank, NP       Future Appointments             In 3 months Cannady, Dorie Rank, NP [facility]

## 2022-08-27 ENCOUNTER — Other Ambulatory Visit: Payer: Self-pay

## 2022-08-28 ENCOUNTER — Other Ambulatory Visit: Payer: Self-pay | Admitting: Nurse Practitioner

## 2022-08-28 MED ORDER — DAPAGLIFLOZIN PROPANEDIOL 10 MG PO TABS: 10.0000 mg | ORAL_TABLET | Freq: Every day | ORAL | 12 refills | Status: DC

## 2022-08-29 ENCOUNTER — Other Ambulatory Visit: Payer: Self-pay

## 2022-08-29 NOTE — Telephone Encounter (Signed)
Put insurance cards in pt's chart.

## 2022-09-20 NOTE — Patient Instructions (Signed)
Diabetes Mellitus Basics  Diabetes mellitus, or diabetes, is a long-term (chronic) disease. It occurs when the body does not properly use sugar (glucose) that is released from food after you eat. Diabetes mellitus may be caused by one or both of these problems: Your pancreas does not make enough of a hormone called insulin. Your body does not react in a normal way to the insulin that it makes. Insulin lets glucose enter cells in your body. This gives you energy. If you have diabetes, glucose cannot get into cells. This causes high blood glucose (hyperglycemia). How to treat and manage diabetes You may need to take insulin or other diabetes medicines daily to keep your glucose in balance. If you are prescribed insulin, you will learn how to give yourself insulin by injection. You may need to adjust the amount of insulin you take based on the foods that you eat. You will need to check your blood glucose levels using a glucose monitor as told by your health care provider. The readings can help determine if you have low or high blood glucose. Generally, you should have these blood glucose levels: Before meals (preprandial): 80-130 mg/dL (4.4-7.2 mmol/L). After meals (postprandial): below 180 mg/dL (10 mmol/L). Hemoglobin A1c (HbA1c) level: less than 7%. Your health care provider will set treatment goals for you. Keep all follow-up visits. This is important. Follow these instructions at home: Diabetes medicines Take your diabetes medicines every day as told by your health care provider. List your diabetes medicines here: Name of medicine: ______________________________ Amount (dose): _______________ Time (a.m./p.m.): _______________ Notes: ___________________________________ Name of medicine: ______________________________ Amount (dose): _______________ Time (a.m./p.m.): _______________ Notes: ___________________________________ Name of medicine: ______________________________ Amount (dose):  _______________ Time (a.m./p.m.): _______________ Notes: ___________________________________ Insulin If you use insulin, list the types of insulin you use here: Insulin type: ______________________________ Amount (dose): _______________ Time (a.m./p.m.): _______________Notes: ___________________________________ Insulin type: ______________________________ Amount (dose): _______________ Time (a.m./p.m.): _______________ Notes: ___________________________________ Insulin type: ______________________________ Amount (dose): _______________ Time (a.m./p.m.): _______________ Notes: ___________________________________ Insulin type: ______________________________ Amount (dose): _______________ Time (a.m./p.m.): _______________ Notes: ___________________________________ Insulin type: ______________________________ Amount (dose): _______________ Time (a.m./p.m.): _______________ Notes: ___________________________________ Managing blood glucose  Check your blood glucose levels using a glucose monitor as told by your health care provider. Write down the times that you check your glucose levels here: Time: _______________ Notes: ___________________________________ Time: _______________ Notes: ___________________________________ Time: _______________ Notes: ___________________________________ Time: _______________ Notes: ___________________________________ Time: _______________ Notes: ___________________________________ Time: _______________ Notes: ___________________________________  Low blood glucose Low blood glucose (hypoglycemia) is when glucose is at or below 70 mg/dL (3.9 mmol/L). Symptoms may include: Feeling: Hungry. Sweaty and clammy. Irritable or easily upset. Dizzy. Sleepy. Having: A fast heartbeat. A headache. A change in your vision. Numbness around the mouth, lips, or tongue. Having trouble with: Moving (coordination). Sleeping. Treating low blood glucose To treat low blood  glucose, eat or drink something containing sugar right away. If you can think clearly and swallow safely, follow the 15:15 rule: Take 15 grams of a fast-acting carb (carbohydrate), as told by your health care provider. Some fast-acting carbs are: Glucose tablets: take 3-4 tablets. Hard candy: eat 3-5 pieces. Fruit juice: drink 4 oz (120 mL). Regular (not diet) soda: drink 4-6 oz (120-180 mL). Honey or sugar: eat 1 Tbsp (15 mL). Check your blood glucose levels 15 minutes after you take the carb. If your glucose is still at or below 70 mg/dL (3.9 mmol/L), take 15 grams of a carb again. If your glucose does not go above 70 mg/dL (3.9 mmol/L) after   3 tries, get help right away. After your glucose goes back to normal, eat a meal or a snack within 1 hour. Treating very low blood glucose If your glucose is at or below 54 mg/dL (3 mmol/L), you have very low blood glucose (severe hypoglycemia). This is an emergency. Do not wait to see if the symptoms will go away. Get medical help right away. Call your local emergency services (911 in the [place] Do not drive yourself to the hospital. Questions to ask your health care provider Should I talk with a diabetes educator? What equipment will I need to care for myself at home? What diabetes medicines do I need? When should I take them? How often do I need to check my blood glucose levels? What number can I call if I have questions? When is my follow-up visit? Where can I find a support group for people with diabetes? Where to find more information American Diabetes Association: www.diabetes.org Association of Diabetes Care and Education Specialists: www.diabeteseducator.org Contact a health care provider if: Your blood glucose is at or above 240 mg/dL (13.3 mmol/L) for 2 days in a row. You have been sick or have had a fever for 2 days or more, and you are not getting better. You have any of these problems for more than 6 hours: You cannot eat or  drink. You feel nauseous. You vomit. You have diarrhea. Get help right away if: Your blood glucose is lower than 54 mg/dL (3 mmol/L). You get confused. You have trouble thinking clearly. You have trouble breathing. These symptoms may represent a serious problem that is an emergency. Do not wait to see if the symptoms will go away. Get medical help right away. Call your local emergency services (911 in the [place] Do not drive yourself to the hospital. Summary Diabetes mellitus is a chronic disease that occurs when the body does not properly use sugar (glucose) that is released from food after you eat. Take insulin and diabetes medicines as told. Check your blood glucose every day, as often as told. Keep all follow-up visits. This is important. This information is not intended to replace advice given to you by your health care provider. Make sure you discuss any questions you have with your health care provider. Document Revised: 10/18/2019 Document Reviewed: 10/18/2019 Elsevier Patient Education  2023 Elsevier Inc.  

## 2022-09-26 ENCOUNTER — Ambulatory Visit (INDEPENDENT_AMBULATORY_CARE_PROVIDER_SITE_OTHER): Payer: Self-pay | Admitting: Nurse Practitioner

## 2022-09-26 ENCOUNTER — Encounter: Payer: Self-pay | Admitting: Nurse Practitioner

## 2022-09-26 VITALS — BP 128/80 | HR 66 | Temp 97.8°F | Resp 18 | Ht 65.0 in | Wt 170.6 lb

## 2022-09-26 DIAGNOSIS — Z0289 Encounter for other administrative examinations: Secondary | ICD-10-CM

## 2022-09-26 NOTE — Progress Notes (Signed)
BP 128/80 (BP Location: Left Arm, Patient Position: Sitting, Cuff Size: Normal)   Pulse 66   Temp 97.8 F (36.6 C) (Oral)   Resp 18   Ht 5\' 5"  (1.651 m)   Wt 170 lb 9.6 oz (77.4 kg)   SpO2 96%   BMI 28.39 kg/m    Subjective:    Patient ID: Joseph Burch, male    DOB: [birth year], 52 y.o.   MRN: [id number]  HPI: Joseph Burch is a 52 y.o. male presenting on 09/26/2022 for DOT Physical.  Previous DOT physical was on.  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  Last DOT was one year ago.  Past Medical History:  Past Medical History:  Diagnosis Date   Diabetes mellitus without complication ([place])    Hematuria    onset 29 years ago- cause unkown to the patient   Hypertension     Surgical History:  Past Surgical History:  Procedure Laterality Date   VASECTOMY      Medications:  Current Outpatient Medications on File Prior to Visit  Medication Sig   amLODipine (NORVASC) 10 MG tablet Take 1 tablet by mouth once daily   CVS Lancets Thin 26G MISC To check blood sugar 2-3 times daily.   glucose blood (CVS GLUCOSE METER TEST STRIPS) test strip To check blood sugar 2-3 times daily.   metFORMIN (GLUCOPHAGE-XR) 500 MG 24 hr tablet Take 2 tablets by mouth twice daily   rosuvastatin (CRESTOR) 10 MG tablet TAKE ONE TABLET BY MOUTH THREE DAYS A WEEK; MONDAY, WEDNESDAY, FRIDAY   STEGLATRO 5 MG TABS tablet Take 5 mg by mouth daily.   No current facility-administered medications on file prior to visit.    Allergies:  Allergies  Allergen Reactions   Atorvastatin Other (See Comments)    Headache side effect   Guaifenesin Itching   Codeine Rash   Other Rash    Social History:  Social History   Socioeconomic History   Marital status: Married    Spouse name: Not on file   Number of children: Not on file   Years of education: Not on file   Highest education level: Associate degree: occupational, Hotel manager, or vocational program  Occupational  History   Occupation: Truck Geophysicist/field seismologist  Tobacco Use   Smoking status: Former    Types: E-cigarettes, Cigarettes    Quit date: 11/05/2011    Years since quitting: 10.8   Smokeless tobacco: Never  Vaping Use   Vaping Use: Never used  Substance and Sexual Activity   Alcohol use: Yes    Comment: rare beer 1-2 per summer   Drug use: Never   Sexual activity: Yes  Other Topics Concern   Not on file  Social History Narrative   Married lives with wife and children x 4.    Social Determinants of Health   Financial Resource Strain: Low Risk  (01/03/2021)   Overall Financial Resource Strain (CARDIA)    Difficulty of Paying Living Expenses: Not very hard  Food Insecurity: No Food Insecurity (02/17/2022)   Hunger Vital Sign    Worried About Running Out of Food in the Last Year: Never true    Ran Out of Food in the Last Year: Never true  Transportation Needs: No Transportation Needs (02/17/2022)   PRAPARE - Hydrologist (Medical): No    Lack of Transportation (Non-Medical): No  Physical Activity: Sufficiently Active (11/07/2020)   Exercise Vital Sign  Days of Exercise per Week: 4 days    Minutes of Exercise per Session: 50 min  Stress: No Stress Concern Present (11/07/2020)   [place]    Feeling of Stress : Only a little  Social Connections: Moderately Integrated (01/16/2021)   Social Connection and Isolation Panel [NHANES]    Frequency of Communication with Friends and Family: More than three times a week    Frequency of Social Gatherings with Friends and Family: More than three times a week    Attends Religious Services: 1 to 4 times per year    Active Member of [employer] or Organizations: No    Attends Archivist Meetings: Never    Marital Status: Married  Human resources officer Violence: Not At Risk (02/17/2022)   Humiliation, Afraid, Rape, and Kick questionnaire    Fear of Current or  Ex-Partner: No    Emotionally Abused: No    Physically Abused: No    Sexually Abused: No   Social History   Tobacco Use  Smoking Status Former   Types: E-cigarettes, Cigarettes   Quit date: 11/05/2011   Years since quitting: 10.8  Smokeless Tobacco Never   Social History   Substance and Sexual Activity  Alcohol Use Yes   Comment: rare beer 1-2 per summer    Family History:  Family History  Problem Relation Age of Onset   Alcohol abuse Mother    Cancer Mother    COPD Mother    Mental illness Mother    Cancer Father    Diabetes Father    Hypertension Father    Drug abuse Sister    Early death Brother     Past medical history, surgical history, medications, allergies, family history and social history reviewed with patient today and changes made to appropriate areas of the chart.   ROS All other ROS negative except what is listed above and in the HPI.      Objective:    BP 128/80 (BP Location: Left Arm, Patient Position: Sitting, Cuff Size: Normal)   Pulse 66   Temp 97.8 F (36.6 C) (Oral)   Resp 18   Ht 5\' 5"  (1.651 m)   Wt 170 lb 9.6 oz (77.4 kg)   SpO2 96%   BMI 28.39 kg/m   Wt Readings from Last 3 Encounters:  09/26/22 170 lb 9.6 oz (77.4 kg)  04/11/22 169 lb 1.6 oz (76.7 kg)  10/11/21 170 lb 3.2 oz (77.2 kg)    Physical Exam Vitals and nursing note reviewed.  Constitutional:      General: He is awake. He is not in acute distress.    Appearance: He is well-developed and well-groomed. He is not ill-appearing or toxic-appearing.  HENT:     Head: Normocephalic and atraumatic.     Right Ear: Hearing, tympanic membrane, ear canal and external ear normal. No drainage.     Left Ear: Hearing, tympanic membrane, ear canal and external ear normal. No drainage.     Ears:     Comments: Whisper test passed at 6 feet bilaterally    Nose: Nose normal.     Mouth/Throat:     Pharynx: Uvula midline.  Eyes:     General: Lids are normal.        Right eye: No  discharge.        Left eye: No discharge.     Extraocular Movements: Extraocular movements intact.     Conjunctiva/sclera: Conjunctivae normal.  Pupils: Pupils are equal, round, and reactive to light.     Visual Fields: Right eye visual fields normal and left eye visual fields normal.     Comments: 70 degrees EOM bilaterally  Neck:     Thyroid: No thyromegaly.     Vascular: No carotid bruit or JVD.     Trachea: Trachea normal.  Cardiovascular:     Rate and Rhythm: Normal rate and regular rhythm.     Heart sounds: Normal heart sounds, S1 normal and S2 normal. No murmur heard.    No gallop.  Pulmonary:     Effort: Pulmonary effort is normal. No accessory muscle usage or respiratory distress.     Breath sounds: Normal breath sounds.  Abdominal:     General: Bowel sounds are normal.     Palpations: Abdomen is soft. There is no hepatomegaly or splenomegaly.     Tenderness: There is no abdominal tenderness.  Musculoskeletal:        General: Normal range of motion.     Cervical back: Normal range of motion and neck supple.     Right lower leg: No edema.     Left lower leg: No edema.  Lymphadenopathy:     Head:     Right side of head: No submental, submandibular, tonsillar, preauricular or posterior auricular adenopathy.     Left side of head: No submental, submandibular, tonsillar, preauricular or posterior auricular adenopathy.     Cervical: No cervical adenopathy.  Skin:    General: Skin is warm and dry.     Capillary Refill: Capillary refill takes less than 2 seconds.     Findings: No rash.  Neurological:     Mental Status: He is alert and oriented to person, place, and time.     Cranial Nerves: Cranial nerves 2-12 are intact.     Motor: Motor function is intact.     Coordination: Coordination is intact.     Gait: Gait is intact.     Deep Tendon Reflexes: Reflexes are normal and symmetric.     Reflex Scores:      Brachioradialis reflexes are 2+ on the right side and 2+ on  the left side.      Patellar reflexes are 2+ on the right side and 2+ on the left side.    Comments: Strength 5/5 to upper and lower.  Psychiatric:        Attention and Perception: Attention normal.        Mood and Affect: Mood normal.        Speech: Speech normal.        Behavior: Behavior normal. Behavior is cooperative.        Thought Content: Thought content normal.        Cognition and Memory: Cognition normal.     Results for orders placed or performed in visit on 04/11/22  Bayer DCA Hb A1c Waived  Result Value Ref Range   HB A1C (BAYER DCA - WAIVED) 6.4 (H) 4.8 - 5.6 %  Basic metabolic panel  Result Value Ref Range   Glucose 144 (H) 70 - 99 mg/dL   BUN 18 6 - 24 mg/dL   Creatinine, Ser 1.08 0.76 - 1.27 mg/dL   eGFR 82 >59 mL/min/1.73   BUN/Creatinine Ratio 17 9 - 20   Sodium 142 134 - 144 mmol/L   Potassium 4.6 3.5 - 5.2 mmol/L   Chloride 101 96 - 106 mmol/L   CO2 22 20 - 29 mmol/L  Calcium 9.7 8.7 - 10.2 mg/dL  Lipid Panel w/o Chol/HDL Ratio  Result Value Ref Range   Cholesterol, Total 121 100 - 199 mg/dL   Triglycerides 109 0 - 149 mg/dL   HDL 44 >39 mg/dL   VLDL Cholesterol Cal 20 5 - 40 mg/dL   LDL Chol Calc (NIH) 57 0 - 99 mg/dL  Hepatitis C antibody  Result Value Ref Range   Hep C Virus Ab Non Reactive Non Reactive  TSH  Result Value Ref Range   TSH 1.690 0.450 - 4.500 uIU/mL  PSA  Result Value Ref Range   Prostate Specific Ag, Serum 0.5 0.0 - 4.0 ng/mL      Assessment & Plan:   Problem List Items Addressed This Visit   None Visit Diagnoses     Encounter for examination required by Department of Transportation (DOT)    -  Primary   DOT physical exam today with full assessment.  Passed for one year, will expire 09/26/2023.        Follow up plan: Return in about 4 weeks (around 10/24/2022) for T2DM, HTN/HLD.   PATIENT COUNSELING:    Advised to avoid cigarette smoking.  I discussed with the patient that most people either abstain from  alcohol or drink within safe limits (<=14/week and <=4 drinks/occasion for males, <=7/weeks and <= 3 drinks/occasion for females) and that the risk for alcohol disorders and other health effects rises proportionally with the number of drinks per week and how often a drinker exceeds daily limits.  Discussed cessation/primary prevention of drug use and availability of treatment for abuse.   Diet: Encouraged to adjust caloric intake to maintain  or achieve ideal body weight, to reduce intake of dietary saturated fat and total fat, to limit sodium intake by avoiding high sodium foods and not adding table salt, and to maintain adequate dietary potassium and calcium preferably from fresh fruits, vegetables, and low-fat dairy products.    Stressed the importance of regular exercise  Injury prevention: Discussed safety belts, safety helmets, smoke detector, smoking near bedding or upholstery.   Dental health: Discussed importance of regular tooth brushing, flossing, and dental visits.    NEXT PREVENTATIVE PHYSICAL DUE IN 1 YEAR. Return in about 4 weeks (around 10/24/2022) for T2DM, HTN/HLD.

## 2022-09-29 ENCOUNTER — Telehealth: Payer: Self-pay | Admitting: Nurse Practitioner

## 2022-09-29 NOTE — Telephone Encounter (Signed)
Called patient to let him know this and he verbalized understanding.  Will come in sometime this week.

## 2022-09-29 NOTE — Telephone Encounter (Signed)
Copied from [place] [phone]. Topic: General - Other >> Sep 29, 2022  9:40 AM [name] wrote: Patient states that Jolene did not sign his DOT Medical card during his visit on 3.29. Patient is asking if a signed copy can be emailed to him [email or web address].

## 2022-10-03 ENCOUNTER — Emergency Department
Admission: EM | Admit: 2022-10-03 | Discharge: 2022-10-03 | Disposition: A | Discharge status: Home / Self Care | Payer: 59 | Attending: Emergency Medicine | Admitting: Emergency Medicine

## 2022-10-03 ENCOUNTER — Emergency Department: Payer: 59

## 2022-10-03 ENCOUNTER — Other Ambulatory Visit: Payer: Self-pay

## 2022-10-03 DIAGNOSIS — I10 Essential (primary) hypertension: Secondary | ICD-10-CM | POA: Diagnosis not present

## 2022-10-03 DIAGNOSIS — W231XXA Caught, crushed, jammed, or pinched between stationary objects, initial encounter: Secondary | ICD-10-CM | POA: Diagnosis not present

## 2022-10-03 DIAGNOSIS — E119 Type 2 diabetes mellitus without complications: Secondary | ICD-10-CM | POA: Insufficient documentation

## 2022-10-03 DIAGNOSIS — M795 Residual foreign body in soft tissue: Secondary | ICD-10-CM | POA: Diagnosis not present

## 2022-10-03 DIAGNOSIS — S51812A Laceration without foreign body of left forearm, initial encounter: Secondary | ICD-10-CM | POA: Diagnosis not present

## 2022-10-03 DIAGNOSIS — Z23 Encounter for immunization: Secondary | ICD-10-CM | POA: Insufficient documentation

## 2022-10-03 DIAGNOSIS — S59912A Unspecified injury of left forearm, initial encounter: Secondary | ICD-10-CM | POA: Diagnosis not present

## 2022-10-03 LAB — CBC WITH DIFFERENTIAL/PLATELET
Abs Immature Granulocytes: 0.03 K/uL (ref 0.00–0.07)
Basophils Absolute: 0.1 K/uL (ref 0.0–0.1)
Basophils Relative: 1 %
Eosinophils Absolute: 0.2 K/uL (ref 0.0–0.5)
Eosinophils Relative: 2 %
HCT: 46.7 % (ref 39.0–52.0)
Hemoglobin: 15.5 g/dL (ref 13.0–17.0)
Immature Granulocytes: 0 %
Lymphocytes Relative: 41 %
Lymphs Abs: 3.4 K/uL (ref 0.7–4.0)
MCH: 30 pg (ref 26.0–34.0)
MCHC: 33.2 g/dL (ref 30.0–36.0)
MCV: 90.5 fL (ref 80.0–100.0)
Monocytes Absolute: 0.7 K/uL (ref 0.1–1.0)
Monocytes Relative: 9 %
Neutro Abs: 3.8 K/uL (ref 1.7–7.7)
Neutrophils Relative %: 47 %
Platelets: 303 K/uL (ref 150–400)
RBC: 5.16 MIL/uL (ref 4.22–5.81)
RDW: 12.2 % (ref 11.5–15.5)
WBC: 8.2 K/uL (ref 4.0–10.5)
nRBC: 0 % (ref 0.0–0.2)

## 2022-10-03 LAB — BASIC METABOLIC PANEL WITH GFR
Anion gap: 14 (ref 5–15)
BUN: 24 mg/dL — ABNORMAL HIGH (ref 6–20)
CO2: 24 mmol/L (ref 22–32)
Calcium: 9.3 mg/dL (ref 8.9–10.3)
Chloride: 99 mmol/L (ref 98–111)
Creatinine, Ser: 1.2 mg/dL (ref 0.61–1.24)
GFR, Estimated: >60 mL/min (ref >60)
Glucose, Bld: 175 mg/dL — ABNORMAL HIGH (ref 70–99)
Potassium: 3.1 mmol/L — ABNORMAL LOW (ref 3.5–5.1)
Sodium: 137 mmol/L (ref 135–145)

## 2022-10-03 MED ORDER — ONDANSETRON HCL 4 MG/2ML IJ SOLN
INTRAMUSCULAR | Status: AC
  Administered 2022-10-03: 4 mg via INTRAVENOUS
  Filled 2022-10-03: qty 2

## 2022-10-03 MED ORDER — TETANUS-DIPHTH-ACELL PERTUSSIS 5-2.5-18.5 LF-MCG/0.5 IM SUSY
0.5000 mL | PREFILLED_SYRINGE | Freq: Once | INTRAMUSCULAR | Status: AC
  Administered 2022-10-03: 0.5 mL via INTRAMUSCULAR
  Filled 2022-10-03: qty 0.5

## 2022-10-03 MED ORDER — LIDOCAINE-EPINEPHRINE 2 %-1:100000 IJ SOLN
20.0000 mL | Freq: Once | INTRAMUSCULAR | Status: AC
  Administered 2022-10-03: 20 mL
  Filled 2022-10-03: qty 1

## 2022-10-03 MED ORDER — MORPHINE SULFATE (PF) 4 MG/ML IV SOLN
INTRAVENOUS | Status: AC
  Administered 2022-10-03: 4 mg via INTRAVENOUS
  Filled 2022-10-03: qty 1

## 2022-10-03 MED ORDER — MORPHINE SULFATE (PF) 4 MG/ML IV SOLN: 4.0000 mg | Freq: Once | INTRAVENOUS | Status: AC

## 2022-10-03 MED ORDER — OXYCODONE-ACETAMINOPHEN 5-325 MG PO TABS: 1.0000 | ORAL_TABLET | Freq: Three times a day (TID) | ORAL | 0 refills | Status: AC | PRN

## 2022-10-03 MED ORDER — ONDANSETRON HCL 4 MG/2ML IJ SOLN: 4.0000 mg | Freq: Once | INTRAMUSCULAR | Status: AC

## 2022-10-03 MED ORDER — CEPHALEXIN 500 MG PO CAPS: 500.0000 mg | ORAL_CAPSULE | Freq: Three times a day (TID) | ORAL | 0 refills | Status: DC

## 2022-10-03 NOTE — ED Notes (Signed)
Provider cleaned and dressed wound with non-adherent pad followed bu gauze wrap. Pt and wife instructed on home care.

## 2022-10-03 NOTE — ED Triage Notes (Addendum)
Pt presents to ED with /co of having his L FA get stuck in a metal grinder. Pt denies blood thinner use. Pt awake and alert.

## 2022-10-03 NOTE — ED Provider Notes (Signed)
[facility] Provider Note    Event Date/Time   First MD Initiated Contact with Patient 10/03/22 1828     (approximate)   History   Trauma (L Forearm from meat grinder)   HPI  Joseph Burch is a 52 y.o. male with a past history of hypertension diabetes who was using a hand-held disc grinder power tool at home to grind metal when he lost control of it and sustained a laceration to his left forearm.  There was immediate profuse bleeding and severe pain.  Denies numbness paresthesia or weakness in the left hand.  No other injuries.  Unknown last tetanus shot.     Physical Exam   Triage Vital Signs: ED Triage Vitals  Enc Vitals Group     BP 10/03/22 1835 108/75     Pulse Rate 10/03/22 1834 60     Resp 10/03/22 1834 18     Temp --      Temp src --      SpO2 10/03/22 1833 98 %     Weight --      Height --      Head Circumference --      Peak Flow --      Pain Score 10/03/22 1835 6     Pain Loc --      Pain Edu? --      Excl. in GC? --     Most recent vital signs: Vitals:   10/03/22 1835 10/03/22 2045  BP: 108/75 132/81  Pulse:  85  Resp:  16  Temp:  98.4 F (36.9 C)  SpO2:  96%    General: Awake, no distress.  CV:  Good peripheral perfusion.  Normal radial and ulnar pulse and capillary refill in the hand Resp:  Normal effort.  Abd:  No distention.  Other:  6 centimeter linear laceration on the volar left forearm with exposed deep subcutaneous tissue and extensor pollicis longus tendon with tear in the overlying fascia.  There is brisk pulsatile arterial bleeding from the wound.   ED Results / Procedures / Treatments   Labs (all labs ordered are listed, but only abnormal results are displayed) Labs Reviewed  BASIC METABOLIC PANEL - Abnormal; Notable for the following components:      Result Value   Potassium 3.1 (*)    Glucose, Bld 175 (*)    BUN 24 (*)    All other components within normal limits  CBC WITH DIFFERENTIAL/PLATELET      RADIOLOGY X-ray left forearm interpreted by me, no fracture.  Small scattered debris.  Radiology report reviewed   PROCEDURES:  .Marland KitchenLaceration Repair  Date/Time: 10/03/2022 8:32 PM  Performed by: Sharman Cheek, MD Authorized by: Sharman Cheek, MD   Consent:    Consent obtained:  Verbal   Consent given by:  Patient   Risks discussed:  Infection, pain, retained foreign body, tendon damage, vascular damage, poor wound healing, nerve damage, need for additional repair and poor cosmetic result   Alternatives discussed:  No treatment Universal protocol:    Patient identity confirmed:  Verbally with patient Anesthesia:    Anesthesia method:  Local infiltration   Local anesthetic:  Lidocaine 2% WITH epi Laceration details:    Location:  Shoulder/arm   Shoulder/arm location:  L lower arm   Length (cm):  6 Pre-procedure details:    Preparation:  Patient was prepped and draped in usual sterile fashion and imaging obtained to evaluate for foreign bodies Exploration:  Limited defect created (wound extended): no     Hemostasis achieved with:  Tied off vessels and tourniquet   Imaging obtained: x-ray     Imaging outcome: foreign body noted (scattered punctate debris, not retrievable)     Wound exploration: wound explored through full range of motion and entire depth of wound visualized     Wound extent: fascia violated and vascular damage     Wound extent: no foreign body, no signs of injury, no nerve damage, no tendon damage and no underlying fracture     Contaminated: no   Treatment:    Area cleansed with:  Povidone-iodine and saline   Amount of cleaning:  Extensive   Irrigation solution:  Sterile saline   Irrigation volume:  500   Irrigation method:  Pressure wash   Debridement:  Moderate   Undermining:  Minimal   Scar revision: no     Layers/structures repaired:  Muscle fascia and deep subcutaneous Muscle fascia:    Suture size:  4-0   Suture material:  Monocryl    Suture technique:  Figure eight   Number of sutures:  2 Deep subcutaneous:    Suture size:  4-0   Suture material:  Monocryl   Suture technique:  Simple interrupted   Number of sutures:  3 Skin repair:    Repair method:  Sutures   Suture size:  4-0   Wound skin closure material used: monocryl.   Suture technique:  Running   Number of sutures:  9 Approximation:    Approximation:  Close Repair type:    Repair type:  Complex Post-procedure details:    Dressing:  Sterile dressing   Procedure completion:  Tolerated well, no immediate complications    MEDICATIONS ORDERED IN ED: Medications  morphine (PF) 4 MG/ML injection 4 mg (4 mg Intravenous Given 10/03/22 1837)  ondansetron (ZOFRAN) injection 4 mg (4 mg Intravenous Given 10/03/22 1838)  Tdap (BOOSTRIX) injection 0.5 mL (0.5 mLs Intramuscular Given 10/03/22 1858)  lidocaine-EPINEPHrine (XYLOCAINE W/EPI) 2 %-1:100000 (with pres) injection 20 mL (20 mLs Infiltration Given by Other 10/03/22 1858)     IMPRESSION / MDM / ASSESSMENT AND PLAN / ED COURSE  I reviewed the triage vital signs and the nursing notes.                              Differential diagnosis includes, but is not limited to, fracture, embedded metallic debris.  Distal nerve, vascular, and muscular function is intact, doubt tendon rupture, nerve bisection, or large arterial disruption   Clinical Course as of 10/03/22 2252  Fri Oct 03, 2022  1859 Arterial bleeding ligated on arrival. NVI, all tendon function intact. Will xray, update tetanus [PS]    Clinical Course User Index [PS] Sharman CheekStafford, Nakyra Bourn, MD    ----------------------------------------- 10:50 PM on 10/03/2022 ----------------------------------------- Excellent hemostasis was obtained after ligating arterial bleeds on arrival.  No retrievable foreign body noted on x-ray or exam.  Wound was cleaned and irrigated copiously, repaired in a multilayer fashion with good result.  After repair, patient again  demonstrated normal motor and neurologic function, normal radial and ulnar pulse and distal capillary refill.  Stable for discharge   FINAL CLINICAL IMPRESSION(S) / ED DIAGNOSES   Final diagnoses:  Laceration of left forearm, initial encounter     Rx / DC Orders   ED Discharge Orders          Ordered    oxyCODONE-acetaminophen (PERCOCET)  5-325 MG tablet  Every 8 hours PRN        10/03/22 2031    cephALEXin (KEFLEX) 500 MG capsule  3 times daily        10/03/22 2031             Note:  This document was prepared using Dragon voice recognition software and may include unintentional dictation errors.   Sharman CheekStafford, Finian Helvey, MD 10/03/22 2252

## 2022-10-19 NOTE — Patient Instructions (Signed)
Be Involved in Your Health Care:  Taking Medications When medications are taken as directed, they can greatly improve your health. But if they are not taken as instructed, they may not work. In some cases, not taking them correctly can be harmful. To help ensure your treatment remains effective and safe, understand your medications and how to take them.  Your lab results, notes and after visit summary will be available on My Chart. We strongly encourage you to use this feature. If lab results are abnormal the clinic will contact you with the appropriate steps. If the clinic does not contact you assume the results are satisfactory. You can always see your results on My Chart. If you have questions regarding your condition, please contact the clinic during office hours. You can also ask questions on My Chart.  We at [facility] are grateful that you chose us to provide care. We strive to provide excellent and compassionate care and are always looking for feedback. If you get a survey from the clinic please complete this.   Diabetes Mellitus Basics  Diabetes mellitus, or diabetes, is a long-term (chronic) disease. It occurs when the body does not properly use sugar (glucose) that is released from food after you eat. Diabetes mellitus may be caused by one or both of these problems: Your pancreas does not make enough of a hormone called insulin. Your body does not react in a normal way to the insulin that it makes. Insulin lets glucose enter cells in your body. This gives you energy. If you have diabetes, glucose cannot get into cells. This causes high blood glucose (hyperglycemia). How to treat and manage diabetes You may need to take insulin or other diabetes medicines daily to keep your glucose in balance. If you are prescribed insulin, you will learn how to give yourself insulin by injection. You may need to adjust the amount of insulin you take based on the foods that you eat. You will  need to check your blood glucose levels using a glucose monitor as told by your health care provider. The readings can help determine if you have low or high blood glucose. Generally, you should have these blood glucose levels: Before meals (preprandial): 80-130 mg/dL (4.4-7.2 mmol/L). After meals (postprandial): below 180 mg/dL (10 mmol/L). Hemoglobin A1c (HbA1c) level: less than 7%. Your health care provider will set treatment goals for you. Keep all follow-up visits. This is important. Follow these instructions at home: Diabetes medicines Take your diabetes medicines every day as told by your health care provider. List your diabetes medicines here: Name of medicine: ______________________________ Amount (dose): _______________ Time (a.m./p.m.): _______________ Notes: ___________________________________ Name of medicine: ______________________________ Amount (dose): _______________ Time (a.m./p.m.): _______________ Notes: ___________________________________ Name of medicine: ______________________________ Amount (dose): _______________ Time (a.m./p.m.): _______________ Notes: ___________________________________ Insulin If you use insulin, list the types of insulin you use here: Insulin type: ______________________________ Amount (dose): _______________ Time (a.m./p.m.): _______________Notes: ___________________________________ Insulin type: ______________________________ Amount (dose): _______________ Time (a.m./p.m.): _______________ Notes: ___________________________________ Insulin type: ______________________________ Amount (dose): _______________ Time (a.m./p.m.): _______________ Notes: ___________________________________ Insulin type: ______________________________ Amount (dose): _______________ Time (a.m./p.m.): _______________ Notes: ___________________________________ Insulin type: ______________________________ Amount (dose): _______________ Time (a.m./p.m.): _______________  Notes: ___________________________________ Managing blood glucose  Check your blood glucose levels using a glucose monitor as told by your health care provider. Write down the times that you check your glucose levels here: Time: _______________ Notes: ___________________________________ Time: _______________ Notes: ___________________________________ Time: _______________ Notes: ___________________________________ Time: _______________ Notes: ___________________________________ Time: _______________ Notes: ___________________________________ Time: _______________ Notes: ___________________________________    Low blood glucose Low blood glucose (hypoglycemia) is when glucose is at or below 70 mg/dL (3.9 mmol/L). Symptoms may include: Feeling: Hungry. Sweaty and clammy. Irritable or easily upset. Dizzy. Sleepy. Having: A fast heartbeat. A headache. A change in your vision. Numbness around the mouth, lips, or tongue. Having trouble with: Moving (coordination). Sleeping. Treating low blood glucose To treat low blood glucose, eat or drink something containing sugar right away. If you can think clearly and swallow safely, follow the 15:15 rule: Take 15 grams of a fast-acting carb (carbohydrate), as told by your health care provider. Some fast-acting carbs are: Glucose tablets: take 3-4 tablets. Hard candy: eat 3-5 pieces. Fruit juice: drink 4 oz (120 mL). Regular (not diet) soda: drink 4-6 oz (120-180 mL). Honey or sugar: eat 1 Tbsp (15 mL). Check your blood glucose levels 15 minutes after you take the carb. If your glucose is still at or below 70 mg/dL (3.9 mmol/L), take 15 grams of a carb again. If your glucose does not go above 70 mg/dL (3.9 mmol/L) after 3 tries, get help right away. After your glucose goes back to normal, eat a meal or a snack within 1 hour. Treating very low blood glucose If your glucose is at or below 54 mg/dL (3 mmol/L), you have very low blood glucose  (severe hypoglycemia). This is an emergency. Do not wait to see if the symptoms will go away. Get medical help right away. Call your local emergency services (911 in the [place] Do not drive yourself to the hospital. Questions to ask your health care provider Should I talk with a diabetes educator? What equipment will I need to care for myself at home? What diabetes medicines do I need? When should I take them? How often do I need to check my blood glucose levels? What number can I call if I have questions? When is my follow-up visit? Where can I find a support group for people with diabetes? Where to find more information American Diabetes Association: www.diabetes.org Association of Diabetes Care and Education Specialists: www.diabeteseducator.org Contact a health care provider if: Your blood glucose is at or above 240 mg/dL (13.3 mmol/L) for 2 days in a row. You have been sick or have had a fever for 2 days or more, and you are not getting better. You have any of these problems for more than 6 hours: You cannot eat or drink. You feel nauseous. You vomit. You have diarrhea. Get help right away if: Your blood glucose is lower than 54 mg/dL (3 mmol/L). You get confused. You have trouble thinking clearly. You have trouble breathing. These symptoms may represent a serious problem that is an emergency. Do not wait to see if the symptoms will go away. Get medical help right away. Call your local emergency services (911 in the [place] Do not drive yourself to the hospital. Summary Diabetes mellitus is a chronic disease that occurs when the body does not properly use sugar (glucose) that is released from food after you eat. Take insulin and diabetes medicines as told. Check your blood glucose every day, as often as told. Keep all follow-up visits. This is important. This information is not intended to replace advice given to you by your health care provider. Make sure you discuss any  questions you have with your health care provider. Document Revised: 10/18/2019 Document Reviewed: 10/18/2019 Elsevier Patient Education  2023 Elsevier Inc.  

## 2022-10-24 ENCOUNTER — Ambulatory Visit: Payer: 59 | Admitting: Nurse Practitioner

## 2022-10-24 ENCOUNTER — Encounter: Payer: Self-pay | Admitting: Nurse Practitioner

## 2022-10-24 VITALS — BP 132/78 | HR 71 | Temp 97.6°F | Ht 66.26 in | Wt 171.0 lb

## 2022-10-24 DIAGNOSIS — R809 Proteinuria, unspecified: Secondary | ICD-10-CM

## 2022-10-24 DIAGNOSIS — I152 Hypertension secondary to endocrine disorders: Secondary | ICD-10-CM | POA: Diagnosis not present

## 2022-10-24 DIAGNOSIS — E785 Hyperlipidemia, unspecified: Secondary | ICD-10-CM

## 2022-10-24 DIAGNOSIS — Z114 Encounter for screening for human immunodeficiency virus [HIV]: Secondary | ICD-10-CM

## 2022-10-24 DIAGNOSIS — E1129 Type 2 diabetes mellitus with other diabetic kidney complication: Secondary | ICD-10-CM | POA: Diagnosis not present

## 2022-10-24 DIAGNOSIS — Z6827 Body mass index (BMI) 27.0-27.9, adult: Secondary | ICD-10-CM | POA: Diagnosis not present

## 2022-10-24 DIAGNOSIS — E1159 Type 2 diabetes mellitus with other circulatory complications: Secondary | ICD-10-CM | POA: Diagnosis not present

## 2022-10-24 DIAGNOSIS — E1169 Type 2 diabetes mellitus with other specified complication: Secondary | ICD-10-CM

## 2022-10-24 LAB — MICROALBUMIN, URINE WAIVED
Creatinine, Urine Waived: 50 mg/dL (ref 10–300)
Microalb, Ur Waived: 80 mg/L — ABNORMAL HIGH (ref 0–19)

## 2022-10-24 LAB — BAYER DCA HB A1C WAIVED: HB A1C (BAYER DCA - WAIVED): 6.9 % — ABNORMAL HIGH (ref 4.8–5.6)

## 2022-10-24 NOTE — Assessment & Plan Note (Signed)
Chronic, stable.  BP at goal in office today.  Did not tolerate ARB in past, although would benefit from ACE or ARB with his diabetes and urine ALB 80 April 2024, consider low dose ACE trial in future, discussed with him.  Recommend he monitor BP at least a few mornings a week at home and document.  DASH diet at home.  Continue current medication regimen and adjust as needed.  Labs: CMP and urine ALB.

## 2022-10-24 NOTE — Progress Notes (Addendum)
BP 132/78 (BP Location: Left Arm, Patient Position: Sitting, Cuff Size: Normal)   Pulse 71   Temp 97.6 F (36.4 C) (Oral)   Ht 5' 6.26" (1.683 m)   Wt 171 lb (77.6 kg)   SpO2 97%   BMI 27.38 kg/m    Subjective:    Patient ID: Joseph Burch, male    DOB: 1971/05/13, 52 y.o.   MRN: [id number]  HPI: Joseph Burch is a 52 y.o. male  Chief Complaint  Patient presents with   Diabetes   Hypertension   Hyperlipidemia   DIABETES A1c last visit was 6.4% in October.  Continues on Steglatro 5 MG daily and taking Metformin XR 1000 MG BID.   Hypoglycemic episodes:no Polydipsia/polyuria: no Visual disturbance: no Chest pain: no Paresthesias: no Glucose Monitoring: no             Accucheck frequency: not checking             Fasting glucose:              Post prandial:             Evening:             Before meals: Taking Insulin?: no             Long acting insulin:             Short acting insulin: Blood Pressure Monitoring: a few times a week Retinal Examination: Up To Date --  Eye Foot Exam: Up to Date Pneumovax: refuses Influenza: refuses Aspirin: yes    HYPERTENSION / HYPERLIPIDEMIA Continues on Amlodipine 10 MG. Currently taking Crestor without issue.  Has taken Losartan in past, made him feel loopy and did not tolerate. Tried Atorvastatin in past which caused a splitting headache.    Is a past smoker, quit >10 years ago -- smoked 1 to 1/2 PPD.   Satisfied with current treatment? yes Duration of hypertension: chronic BP monitoring frequency:  BP range:  BP medication side effects: no Duration of hyperlipidemia: chronic Aspirin: no Recent stressors: no Recurrent headaches: no Visual changes: no Palpitations: no Dyspnea: no Chest pain: no Lower extremity edema: no Dizzy/lightheaded: no  The ASCVD Risk score (Arnett DK, et al., 2019) failed to calculate for the following reasons:   The valid total cholesterol range is 130 to 320 mg/dL   Relevant past  medical, surgical, family and social history reviewed and updated as indicated. Interim medical history since our last visit reviewed. Allergies and medications reviewed and updated.  Review of Systems  Constitutional:  Negative for activity change, diaphoresis, fatigue and fever.  Respiratory:  Negative for cough, chest tightness, shortness of breath and wheezing.   Cardiovascular:  Negative for chest pain, palpitations and leg swelling.  Gastrointestinal: Negative.   Endocrine: Negative for cold intolerance, heat intolerance, polydipsia, polyphagia and polyuria.  Neurological: Negative.   Psychiatric/Behavioral: Negative.      Per HPI unless specifically indicated above     Objective:    BP 132/78 (BP Location: Left Arm, Patient Position: Sitting, Cuff Size: Normal)   Pulse 71   Temp 97.6 F (36.4 C) (Oral)   Ht 5' 6.26" (1.683 m)   Wt 171 lb (77.6 kg)   SpO2 97%   BMI 27.38 kg/m   Wt Readings from Last 3 Encounters:  10/24/22 171 lb (77.6 kg)  09/26/22 170 lb 9.6 oz (77.4 kg)  04/11/22 169 lb 1.6 oz (76.7 kg)    Physical  Exam Vitals and nursing note reviewed.  Constitutional:      General: He is awake. He is not in acute distress.    Appearance: He is well-developed and well-groomed. He is not ill-appearing or toxic-appearing.  HENT:     Head: Normocephalic and atraumatic.     Right Ear: Hearing, tympanic membrane, ear canal and external ear normal. No drainage.     Left Ear: Hearing, tympanic membrane, ear canal and external ear normal. No drainage.  Eyes:     General: Lids are normal.        Right eye: No discharge.        Left eye: No discharge.     Conjunctiva/sclera: Conjunctivae normal.     Pupils: Pupils are equal, round, and reactive to light.  Neck:     Thyroid: No thyromegaly.     Vascular: No carotid bruit.  Cardiovascular:     Rate and Rhythm: Normal rate and regular rhythm.     Heart sounds: Normal heart sounds, S1 normal and S2 normal. No murmur  heard.    No gallop.  Pulmonary:     Effort: Pulmonary effort is normal. No accessory muscle usage or respiratory distress.     Breath sounds: Normal breath sounds.  Abdominal:     General: Bowel sounds are normal.     Palpations: Abdomen is soft. There is no hepatomegaly or splenomegaly.  Musculoskeletal:        General: Normal range of motion.     Cervical back: Normal range of motion and neck supple.     Right lower leg: No edema.     Left lower leg: No edema.  Lymphadenopathy:     Cervical: No cervical adenopathy.  Skin:    General: Skin is warm and dry.     Capillary Refill: Capillary refill takes less than 2 seconds.     Findings: No rash.  Neurological:     Mental Status: He is alert and oriented to person, place, and time.     Deep Tendon Reflexes: Reflexes are normal and symmetric.  Psychiatric:        Attention and Perception: Attention normal.        Mood and Affect: Mood normal.        Speech: Speech normal.        Behavior: Behavior normal. Behavior is cooperative.        Thought Content: Thought content normal.    Diabetic Foot Exam - Simple   Simple Foot Form Visual Inspection No deformities, no ulcerations, no other skin breakdown bilaterally: Yes Sensation Testing Intact to touch and monofilament testing bilaterally: Yes Pulse Check Posterior Tibialis and Dorsalis pulse intact bilaterally: Yes Comments     Results for orders placed or performed during the hospital encounter of 10/03/22  Basic metabolic panel  Result Value Ref Range   Sodium 137 135 - 145 mmol/L   Potassium 3.1 (L) 3.5 - 5.1 mmol/L   Chloride 99 98 - 111 mmol/L   CO2 24 22 - 32 mmol/L   Glucose, Bld 175 (H) 70 - 99 mg/dL   BUN 24 (H) 6 - 20 mg/dL   Creatinine, Ser 0.98 0.61 - 1.24 mg/dL   Calcium 9.3 8.9 - 11.9 mg/dL   GFR, Estimated >14 >78 mL/min   Anion gap 14 5 - 15  CBC with Differential  Result Value Ref Range   WBC 8.2 4.0 - 10.5 K/uL   RBC 5.16 4.22 - 5.81 MIL/uL  Hemoglobin 15.5 13.0 - 17.0 g/dL   HCT 16.1 09.6 - 04.5 %   MCV 90.5 80.0 - 100.0 fL   MCH 30.0 26.0 - 34.0 pg   MCHC 33.2 30.0 - 36.0 g/dL   RDW 40.9 81.1 - 91.4 %   Platelets 303 150 - 400 K/uL   nRBC 0.0 0.0 - 0.2 %   Neutrophils Relative % 47 %   Neutro Abs 3.8 1.7 - 7.7 K/uL   Lymphocytes Relative 41 %   Lymphs Abs 3.4 0.7 - 4.0 K/uL   Monocytes Relative 9 %   Monocytes Absolute 0.7 0.1 - 1.0 K/uL   Eosinophils Relative 2 %   Eosinophils Absolute 0.2 0.0 - 0.5 K/uL   Basophils Relative 1 %   Basophils Absolute 0.1 0.0 - 0.1 K/uL   Immature Granulocytes 0 %   Abs Immature Granulocytes 0.03 0.00 - 0.07 K/uL      Assessment & Plan:   Problem List Items Addressed This Visit       Cardiovascular and Mediastinum   Hypertension associated with diabetes (HCC)    Chronic, stable.  BP at goal in office today.  Did not tolerate ARB in past, although would benefit from ACE or ARB with his diabetes and urine ALB 80 April 2024, consider low dose ACE trial in future, discussed with him.  Recommend he monitor BP at least a few mornings a week at home and document.  DASH diet at home.  Continue current medication regimen and adjust as needed.  Labs: CMP and urine ALB.         Relevant Orders   Bayer DCA Hb A1c Waived   Microalbumin, Urine Waived     Endocrine   Hyperlipidemia associated with type 2 diabetes mellitus (HCC)    Chronic, ongoing.  Continue current medication regimen and adjust as needed.  Lipid panel today.      Relevant Orders   Bayer DCA Hb A1c Waived   Comprehensive metabolic panel   Lipid Panel w/o Chol/HDL Ratio   Type 2 diabetes mellitus with proteinuria (HCC) - Primary    Chronic, ongoing. A1c today 6.9%, mild trend up from previous 6.4% - initial was 9.3%. Praised for ongoing success.  Urine ALB 80 April 2024.  Did not tolerate ARB in past, could consider trial of low dose ACE in future, discussed with patient. - At this time will continue Metformin XR 1000 MG  BID and Steglatro 5 MG as offering benefit, CCM assistance in place.  Would benefit change to [place] in future for kidney health, but currently not covered. - Recommend he monitor BS 2-3 times daily with goal fasting <130 and goal 2 hours post meal <180.  Document and bring to visits.   - Refuses vaccinations. - Eye and foot exams up to date. Return in 6 months.      Relevant Orders   Bayer DCA Hb A1c Waived   Microalbumin, Urine Waived     Other   BMI 27.0-27.9,adult    BMI 27.38.  Recommended eating smaller high protein, low fat meals more frequently and exercising 30 mins a day 5 times a week with a goal of 10-15lb weight loss in the next 3 months. Patient voiced their understanding and motivation to adhere to these recommendations.         Follow up plan: Return in about 6 months (around 04/25/2023) for T2DM, HTN/HLD.

## 2022-10-24 NOTE — Assessment & Plan Note (Addendum)
Chronic, ongoing. A1c today 6.9%, mild trend up from previous 6.4% - initial was 9.3%. Praised for ongoing success.  Urine ALB 80 April 2024.  Did not tolerate ARB in past, could consider trial of low dose ACE in future, discussed with patient. - At this time will continue Metformin XR 1000 MG BID and Steglatro 5 MG as offering benefit, CCM assistance in place.  Would benefit change to [place] in future for kidney health, but currently not covered. - Recommend he monitor BS 2-3 times daily with goal fasting <130 and goal 2 hours post meal <180.  Document and bring to visits.   - Refuses vaccinations. - Eye and foot exams up to date. Return in 6 months.

## 2022-10-24 NOTE — Assessment & Plan Note (Signed)
Chronic, ongoing.  Continue current medication regimen and adjust as needed. Lipid panel today. 

## 2022-10-24 NOTE — Assessment & Plan Note (Signed)
BMI 27.38.  Recommended eating smaller high protein, low fat meals more frequently and exercising 30 mins a day 5 times a week with a goal of 10-15lb weight loss in the next 3 months. Patient voiced their understanding and motivation to adhere to these recommendations.

## 2022-10-25 LAB — LIPID PANEL W/O CHOL/HDL RATIO
Cholesterol, Total: 128 mg/dL (ref 100–199)
HDL: 42 mg/dL (ref >39)
LDL Chol Calc (NIH): 58 mg/dL (ref 0–99)
Triglycerides: 167 mg/dL — ABNORMAL HIGH (ref 0–149)
VLDL Cholesterol Cal: 28 mg/dL (ref 5–40)

## 2022-10-25 LAB — COMPREHENSIVE METABOLIC PANEL
ALT: 20 IU/L (ref 0–44)
AST: 19 IU/L (ref 0–40)
Albumin/Globulin Ratio: 2.1 (ref 1.2–2.2)
Albumin: 4.4 g/dL (ref 3.8–4.9)
Alkaline Phosphatase: 99 IU/L (ref 44–121)
BUN/Creatinine Ratio: 15 (ref 9–20)
BUN: 16 mg/dL (ref 6–24)
Bilirubin Total: 0.5 mg/dL (ref 0.0–1.2)
CO2: 21 mmol/L (ref 20–29)
Calcium: 9.3 mg/dL (ref 8.7–10.2)
Chloride: 103 mmol/L (ref 96–106)
Creatinine, Ser: 1.05 mg/dL (ref 0.76–1.27)
Globulin, Total: 2.1 g/dL (ref 1.5–4.5)
Glucose: 168 mg/dL — ABNORMAL HIGH (ref 70–99)
Potassium: 4.3 mmol/L (ref 3.5–5.2)
Sodium: 139 mmol/L (ref 134–144)
Total Protein: 6.5 g/dL (ref 6.0–8.5)
eGFR: 85 mL/min/{1.73_m2} (ref >59)

## 2022-10-26 NOTE — Progress Notes (Signed)
Contacted via MyChart   Good morning Joseph Burch, your labs have returned: - Kidney function, creatinine and eGFR, remains normal, as is liver function, AST and ALT.  - Cholesterol labs remain stable.  Continue all current medications.  You are doing a great job!!  Any questions? Keep being amazing!!  Thank you for allowing me to participate in your care.  I appreciate you. Kindest regards, Verginia Toohey

## 2022-10-28 ENCOUNTER — Other Ambulatory Visit: Payer: Self-pay | Admitting: Nurse Practitioner

## 2022-10-29 NOTE — Telephone Encounter (Signed)
Requested Prescriptions  Pending Prescriptions Disp Refills   amLODipine (NORVASC) 10 MG tablet [Pharmacy Med Name: amLODIPine Besylate 10 MG Oral Tablet] 90 tablet 0    Sig: Take 1 tablet by mouth once daily     Cardiovascular: Calcium Channel Blockers 2 Passed - 10/28/2022  6:50 AM      Passed - Last BP in normal range    BP Readings from Last 1 Encounters:  10/24/22 132/78         Passed - Last Heart Rate in normal range    Pulse Readings from Last 1 Encounters:  10/24/22 71         Passed - Valid encounter within last 6 months    Recent Outpatient Visits           5 days ago Type 2 diabetes mellitus with proteinuria (HCC)   [place] [facility] [place], Corrie Dandy T, NP   1 month ago Encounter for examination required by Department of Transportation (DOT)   [place] [place], [place] T, NP   6 months ago Type 2 diabetes mellitus with proteinuria (HCC)   [place] [facility] [place], Corrie Dandy T, NP   1 year ago Encounter for examination required by Department of Transportation (DOT)   [place] [facility] [place], Corrie Dandy T, NP   1 year ago Subconjunctival hemorrhage of right eye   [place] [facility] [place], Dorie Rank, NP       Future Appointments             In 5 months Cannady, Dorie Rank, NP [place] [facility], [facility]

## 2022-10-31 ENCOUNTER — Other Ambulatory Visit: Payer: Self-pay | Admitting: Nurse Practitioner

## 2022-10-31 NOTE — Telephone Encounter (Signed)
Requested Prescriptions  Pending Prescriptions Disp Refills   rosuvastatin (CRESTOR) 10 MG tablet [Pharmacy Med Name: Rosuvastatin Calcium 10 MG Oral Tablet] 36 tablet 0    Sig: TAKE ONE TABLET BY MOUTH THREE DAYS A WEEK; MONDAY, WEDNESDAY, FRIDAY     Cardiovascular:  Antilipid - Statins 2 Failed - 10/31/2022  2:57 PM      Failed - Lipid Panel in normal range within the last 12 months    Cholesterol, Total  Date Value Ref Range Status  10/24/2022 128 100 - 199 mg/dL Final   LDL Chol Calc (NIH)  Date Value Ref Range Status  10/24/2022 58 0 - 99 mg/dL Final   HDL  Date Value Ref Range Status  10/24/2022 42 >39 mg/dL Final   Triglycerides  Date Value Ref Range Status  10/24/2022 167 (H) 0 - 149 mg/dL Final         Passed - Cr in normal range and within 360 days    Creatinine, Ser  Date Value Ref Range Status  10/24/2022 1.05 0.76 - 1.27 mg/dL Final   Creatinine,U  Date Value Ref Range Status  11/21/2019 118.3 mg/dL Final         Passed - Patient is not pregnant      Passed - Valid encounter within last 12 months    Recent Outpatient Visits           1 week ago Type 2 diabetes mellitus with proteinuria (HCC)   [place] [facility] [place], Corrie Dandy T, NP   1 month ago Encounter for examination required by Department of Transportation (DOT)   [place] [facility] [place], [place] T, NP   6 months ago Type 2 diabetes mellitus with proteinuria (HCC)   [place] [facility] [place], Corrie Dandy T, NP   1 year ago Encounter for examination required by Department of Transportation (DOT)   [place] [facility] [place], Corrie Dandy T, NP   1 year ago Subconjunctival hemorrhage of right eye   [place] [facility] [place], Dorie Rank, NP       Future Appointments             In 5 months Cannady, Dorie Rank, NP [place] [facility], [facility]

## 2022-11-27 ENCOUNTER — Other Ambulatory Visit: Payer: Self-pay | Admitting: Nurse Practitioner

## 2022-11-27 NOTE — Telephone Encounter (Signed)
Requested medication (s) are due for refill today: Amount not specified  Requested medication (s) are on the active medication list: yes    Last refill: 09/26/22     ? Quantity  Future visit scheduled yes  04/24/23  Notes to clinic: Historical provider, please review. Thank you.  Requested Prescriptions  Pending Prescriptions Disp Refills   STEGLATRO 5 MG TABS tablet [Pharmacy Med Name: Steglatro 5 MG Oral Tablet] 90 tablet 0    Sig: Take 1 tablet by mouth once daily     Endocrinology: Diabetes - SGLT2 Inhibitors - ertugliflozin Passed - 11/27/2022  6:50 AM      Passed - Cr in normal range and within 360 days    Creatinine, Ser  Date Value Ref Range Status  10/24/2022 1.05 0.76 - 1.27 mg/dL Final   Creatinine,U  Date Value Ref Range Status  11/21/2019 118.3 mg/dL Final         Passed - HBA1C is between 0 and 7.9 and within 180 days    HB A1C (BAYER DCA - WAIVED)  Date Value Ref Range Status  10/24/2022 6.9 (H) 4.8 - 5.6 % Final    Comment:             Prediabetes: 5.7 - 6.4          Diabetes: >6.4          Glycemic control for adults with diabetes: <7.0          Passed - eGFR is 40 or above and within 360 days    GFR calc Af Amer  Date Value Ref Range Status  09/24/2019 >60 >60 mL/min Final   GFR, Estimated  Date Value Ref Range Status  10/03/2022 >60 >60 mL/min Final    Comment:    (NOTE) Calculated using the CKD-EPI Creatinine Equation (2021)    GFR  Date Value Ref Range Status  11/21/2019 68.44 >60.00 mL/min Final   eGFR  Date Value Ref Range Status  10/24/2022 85 >59 mL/min/1.73 Final         Passed - Valid encounter within last 6 months    Recent Outpatient Visits           1 month ago Type 2 diabetes mellitus with proteinuria (HCC)   [place] [facility] [place], Corrie Dandy T, NP   2 months ago Encounter for examination required by Department of Transportation (DOT)   [place] [facility] [place], [place] T, NP   7  months ago Type 2 diabetes mellitus with proteinuria (HCC)   [place] [facility] [place], Corrie Dandy T, NP   1 year ago Encounter for examination required by Department of Transportation (DOT)   [place] [facility] [place], Corrie Dandy T, NP   1 year ago Subconjunctival hemorrhage of right eye   [place] [facility] [place], Dorie Rank, NP       Future Appointments             In 4 months Cannady, Dorie Rank, NP [place] [facility], [facility]

## 2022-12-12 DIAGNOSIS — H16012 Central corneal ulcer, left eye: Secondary | ICD-10-CM | POA: Diagnosis not present

## 2022-12-13 DIAGNOSIS — H16012 Central corneal ulcer, left eye: Secondary | ICD-10-CM | POA: Diagnosis not present

## 2022-12-14 DIAGNOSIS — H16012 Central corneal ulcer, left eye: Secondary | ICD-10-CM | POA: Diagnosis not present

## 2022-12-19 DIAGNOSIS — H16012 Central corneal ulcer, left eye: Secondary | ICD-10-CM | POA: Diagnosis not present

## 2022-12-29 ENCOUNTER — Telehealth: Payer: Self-pay

## 2022-12-29 ENCOUNTER — Other Ambulatory Visit: Payer: Self-pay | Admitting: Nurse Practitioner

## 2022-12-29 MED ORDER — EMPAGLIFLOZIN 10 MG PO TABS: 10.0000 mg | ORAL_TABLET | Freq: Every day | ORAL | 4 refills | Status: DC

## 2022-12-29 NOTE — Telephone Encounter (Signed)
Patient notified of medication change.

## 2022-12-29 NOTE — Telephone Encounter (Signed)
Fax received from [place] stating that the patient's insurance prefers [employer] over [employer]. Can the patient be changed to this? Or attempt PA for Steglatro?

## 2022-12-30 NOTE — Telephone Encounter (Unsigned)
Copied from CRM [id number]. Topic: General - Other >> Dec 29, 2022  5:14 PM Joseph Burch wrote: Reason for CRM: The patient has returned a missed phone call from B Russell   Please contact further when possible

## 2023-01-09 DIAGNOSIS — Z7984 Long term (current) use of oral hypoglycemic drugs: Secondary | ICD-10-CM | POA: Diagnosis not present

## 2023-01-09 DIAGNOSIS — H16012 Central corneal ulcer, left eye: Secondary | ICD-10-CM | POA: Diagnosis not present

## 2023-01-09 DIAGNOSIS — E119 Type 2 diabetes mellitus without complications: Secondary | ICD-10-CM | POA: Diagnosis not present

## 2023-01-09 DIAGNOSIS — H52223 Regular astigmatism, bilateral: Secondary | ICD-10-CM | POA: Diagnosis not present

## 2023-01-09 LAB — HM DIABETES EYE EXAM

## 2023-01-13 ENCOUNTER — Encounter: Payer: Self-pay | Admitting: Nurse Practitioner

## 2023-01-22 ENCOUNTER — Other Ambulatory Visit: Payer: Self-pay | Admitting: Nurse Practitioner

## 2023-01-22 NOTE — Telephone Encounter (Signed)
Requested Prescriptions  Pending Prescriptions Disp Refills   amLODipine (NORVASC) 10 MG tablet [Pharmacy Med Name: amLODIPine Besylate 10 MG Oral Tablet] 90 tablet 0    Sig: Take 1 tablet by mouth once daily     Cardiovascular: Calcium Channel Blockers 2 Passed - 01/22/2023  6:50 AM      Passed - Last BP in normal range    BP Readings from Last 1 Encounters:  10/24/22 132/78         Passed - Last Heart Rate in normal range    Pulse Readings from Last 1 Encounters:  10/24/22 71         Passed - Valid encounter within last 6 months    Recent Outpatient Visits           3 months ago Type 2 diabetes mellitus with proteinuria (HCC)   [place] [facility] [place], Corrie Dandy T, NP   3 months ago Encounter for examination required by Department of Transportation (DOT)   [place] [facility] [place], [place] T, NP   9 months ago Type 2 diabetes mellitus with proteinuria (HCC)   [place] [facility] [place], Corrie Dandy T, NP   1 year ago Encounter for examination required by Department of Transportation (DOT)   [place] [facility] [place], Corrie Dandy T, NP   1 year ago Subconjunctival hemorrhage of right eye   [place] [facility] [place], Dorie Rank, NP       Future Appointments             In 3 months Cannady, Dorie Rank, NP  [facility], [facility]

## 2023-02-27 DIAGNOSIS — E119 Type 2 diabetes mellitus without complications: Secondary | ICD-10-CM | POA: Diagnosis not present

## 2023-02-27 DIAGNOSIS — H04123 Dry eye syndrome of bilateral lacrimal glands: Secondary | ICD-10-CM | POA: Diagnosis not present

## 2023-02-27 DIAGNOSIS — H16012 Central corneal ulcer, left eye: Secondary | ICD-10-CM | POA: Diagnosis not present

## 2023-02-27 DIAGNOSIS — Z7984 Long term (current) use of oral hypoglycemic drugs: Secondary | ICD-10-CM | POA: Diagnosis not present

## 2023-04-15 ENCOUNTER — Other Ambulatory Visit: Payer: Self-pay | Admitting: Nurse Practitioner

## 2023-04-15 NOTE — Telephone Encounter (Signed)
Requested Prescriptions  Pending Prescriptions Disp Refills   amLODipine (NORVASC) 10 MG tablet [Pharmacy Med Name: amLODIPine Besylate 10 MG Oral Tablet] 30 tablet 0    Sig: Take 1 tablet by mouth once daily     Cardiovascular: Calcium Channel Blockers 2 Passed - 04/15/2023  6:50 AM      Passed - Last BP in normal range    BP Readings from Last 1 Encounters:  10/24/22 132/78         Passed - Last Heart Rate in normal range    Pulse Readings from Last 1 Encounters:  10/24/22 71         Passed - Valid encounter within last 6 months    Recent Outpatient Visits           5 months ago Type 2 diabetes mellitus with proteinuria (HCC)   [place] [facility] [place], Corrie Dandy T, NP   6 months ago Encounter for examination required by Department of Transportation (DOT)   [place] [facility] [place], Corrie Dandy T, NP   1 year ago Type 2 diabetes mellitus with proteinuria (HCC)   [place] [facility] [place], Corrie Dandy T, NP   1 year ago Encounter for examination required by Department of Transportation (DOT)   [place] [facility] [place], Corrie Dandy T, NP   1 year ago Subconjunctival hemorrhage of right eye   [place] [facility] [place], Dorie Rank, NP       Future Appointments             In 1 week Cannady, Dorie Rank, NP  [facility]

## 2023-04-19 NOTE — Patient Instructions (Signed)
 Be Involved in Caring For Your Health:  Taking Medications When medications are taken as directed, they can greatly improve your health. But if they are not taken as prescribed, they may not work. In some cases, not taking them correctly can be harmful. To help ensure your treatment remains effective and safe, understand your medications and how to take them. Bring your medications to each visit for review by your provider.  Your lab results, notes, and after visit summary will be available on My Chart. We strongly encourage you to use this feature. If lab results are abnormal the clinic will contact you with the appropriate steps. If the clinic does not contact you assume the results are satisfactory. You can always view your results on My Chart. If you have questions regarding your health or results, please contact the clinic during office hours. You can also ask questions on My Chart.  We at [facility] are grateful that you chose [place] to provide your care. We strive to provide evidence-based and compassionate care and are always looking for feedback. If you get a survey from the clinic please complete this so we can hear your opinions.  Diabetes Mellitus and Nutrition, Adult When you have diabetes, or diabetes mellitus, it is very important to have healthy eating habits because your blood sugar (glucose) levels are greatly affected by what you eat and drink. Eating healthy foods in the right amounts, at about the same times every day, can help you: Manage your blood glucose. Lower your risk of heart disease. Improve your blood pressure. Reach or maintain a healthy weight. What can affect my meal plan? Every person with diabetes is different, and each person has different needs for a meal plan. Your health care provider may recommend that you work with a dietitian to make a meal plan that is best for you. Your meal plan may vary depending on factors such as: The calories you need. The  medicines you take. Your weight. Your blood glucose, blood pressure, and cholesterol levels. Your activity level. Other health conditions you have, such as heart or kidney disease. How do carbohydrates affect me? Carbohydrates, also called carbs, affect your blood glucose level more than any other type of food. Eating carbs raises the amount of glucose in your blood. It is important to know how many carbs you can safely have in each meal. This is different for every person. Your dietitian can help you calculate how many carbs you should have at each meal and for each snack. How does alcohol affect me? Alcohol can cause a decrease in blood glucose (hypoglycemia), especially if you use insulin or take certain diabetes medicines by mouth. Hypoglycemia can be a life-threatening condition. Symptoms of hypoglycemia, such as sleepiness, dizziness, and confusion, are similar to symptoms of having too much alcohol. Do not drink alcohol if: Your health care provider tells you not to drink. You are pregnant, may be pregnant, or are planning to become pregnant. If you drink alcohol: Limit how much you have to: 0-1 drink a day for women. 0-2 drinks a day for men. Know how much alcohol is in your drink. In the [place] one drink equals one 12 oz bottle of beer (355 mL), one 5 oz glass of wine (148 mL), or one 1 oz glass of hard liquor (44 mL). Keep yourself hydrated with water, diet soda, or unsweetened iced tea. Keep in mind that regular soda, juice, and other mixers may contain a lot of sugar and must  be counted as carbs. What are tips for following this plan?  Reading food labels Start by checking the serving size on the Nutrition Facts label of packaged foods and drinks. The number of calories and the amount of carbs, fats, and other nutrients listed on the label are based on one serving of the item. Many items contain more than one serving per package. Check the total grams (g) of carbs in one  serving. Check the number of grams of saturated fats and trans fats in one serving. Choose foods that have a low amount or none of these fats. Check the number of milligrams (mg) of salt (sodium) in one serving. Most people should limit total sodium intake to less than 2,300 mg per day. Always check the nutrition information of foods labeled as "low-fat" or "nonfat." These foods may be higher in added sugar or refined carbs and should be avoided. Talk to your dietitian to identify your daily goals for nutrients listed on the label. Shopping Avoid buying canned, pre-made, or processed foods. These foods tend to be high in fat, sodium, and added sugar. Shop around the outside edge of the grocery store. This is where you will most often find fresh fruits and vegetables, bulk grains, fresh meats, and fresh dairy products. Cooking Use low-heat cooking methods, such as baking, instead of high-heat cooking methods, such as deep frying. Cook using healthy oils, such as olive, canola, or sunflower oil. Avoid cooking with butter, cream, or high-fat meats. Meal planning Eat meals and snacks regularly, preferably at the same times every day. Avoid going long periods of time without eating. Eat foods that are high in fiber, such as fresh fruits, vegetables, beans, and whole grains. Eat 4-6 oz (112-168 g) of lean protein each day, such as lean meat, chicken, fish, eggs, or tofu. One ounce (oz) (28 g) of lean protein is equal to: 1 oz (28 g) of meat, chicken, or fish. 1 egg.  cup (62 g) of tofu. Eat some foods each day that contain healthy fats, such as avocado, nuts, seeds, and fish. What foods should I eat? Fruits Berries. Apples. Oranges. Peaches. Apricots. Plums. Grapes. Mangoes. Papayas. Pomegranates. Kiwi. Cherries. Vegetables Leafy greens, including lettuce, spinach, kale, chard, collard greens, mustard greens, and cabbage. Beets. Cauliflower. Broccoli. Carrots. Green beans. Tomatoes. Peppers.  Onions. Cucumbers. Brussels sprouts. Grains Whole grains, such as whole-wheat or whole-grain bread, crackers, tortillas, cereal, and pasta. Unsweetened oatmeal. Quinoa. Brown or wild rice. Meats and other proteins Seafood. Poultry without skin. Lean cuts of poultry and beef. Tofu. Nuts. Seeds. Dairy Low-fat or fat-free dairy products such as milk, yogurt, and cheese. The items listed above may not be a complete list of foods and beverages you can eat and drink. Contact a dietitian for more information. What foods should I avoid? Fruits Fruits canned with syrup. Vegetables Canned vegetables. Frozen vegetables with butter or cream sauce. Grains Refined white flour and flour products such as bread, pasta, snack foods, and cereals. Avoid all processed foods. Meats and other proteins Fatty cuts of meat. Poultry with skin. Breaded or fried meats. Processed meat. Avoid saturated fats. Dairy Full-fat yogurt, cheese, or milk. Beverages Sweetened drinks, such as soda or iced tea. The items listed above may not be a complete list of foods and beverages you should avoid. Contact a dietitian for more information. Questions to ask a health care provider Do I need to meet with a certified diabetes care and education specialist? Do I need to meet with a  dietitian? What number can I call if I have questions? When are the best times to check my blood glucose? Where to find more information: American Diabetes Association: diabetes.org Academy of Nutrition and Dietetics: eatright[employer] of Diabetes and Digestive and Kidney Diseases: StageSync.si Association of Diabetes Care & Education Specialists: diabeteseducator.org Summary It is important to have healthy eating habits because your blood sugar (glucose) levels are greatly affected by what you eat and drink. It is important to use alcohol carefully. A healthy meal plan will help you manage your blood glucose and lower your risk of  heart disease. Your health care provider may recommend that you work with a dietitian to make a meal plan that is best for you. This information is not intended to replace advice given to you by your health care provider. Make sure you discuss any questions you have with your health care provider. Document Revised: 01/18/2020 Document Reviewed: 01/18/2020 Elsevier Patient Education  2024 [employer].

## 2023-04-24 ENCOUNTER — Encounter: Payer: Self-pay | Admitting: Nurse Practitioner

## 2023-04-24 ENCOUNTER — Ambulatory Visit: Payer: 59 | Admitting: Nurse Practitioner

## 2023-04-24 VITALS — BP 138/77 | HR 64 | Temp 98.1°F | Ht 65.0 in | Wt 173.0 lb

## 2023-04-24 DIAGNOSIS — Z114 Encounter for screening for human immunodeficiency virus [HIV]: Secondary | ICD-10-CM

## 2023-04-24 DIAGNOSIS — E1129 Type 2 diabetes mellitus with other diabetic kidney complication: Secondary | ICD-10-CM | POA: Diagnosis not present

## 2023-04-24 DIAGNOSIS — N400 Enlarged prostate without lower urinary tract symptoms: Secondary | ICD-10-CM | POA: Diagnosis not present

## 2023-04-24 DIAGNOSIS — I152 Hypertension secondary to endocrine disorders: Secondary | ICD-10-CM | POA: Diagnosis not present

## 2023-04-24 DIAGNOSIS — E1159 Type 2 diabetes mellitus with other circulatory complications: Secondary | ICD-10-CM

## 2023-04-24 DIAGNOSIS — Z2821 Immunization not carried out because of patient refusal: Secondary | ICD-10-CM

## 2023-04-24 DIAGNOSIS — E785 Hyperlipidemia, unspecified: Secondary | ICD-10-CM | POA: Diagnosis not present

## 2023-04-24 DIAGNOSIS — E1169 Type 2 diabetes mellitus with other specified complication: Secondary | ICD-10-CM

## 2023-04-24 DIAGNOSIS — R809 Proteinuria, unspecified: Secondary | ICD-10-CM

## 2023-04-24 DIAGNOSIS — Z6827 Body mass index (BMI) 27.0-27.9, adult: Secondary | ICD-10-CM

## 2023-04-24 LAB — BAYER DCA HB A1C WAIVED: HB A1C (BAYER DCA - WAIVED): 7.8 % — ABNORMAL HIGH (ref 4.8–5.6)

## 2023-04-24 MED ORDER — METFORMIN HCL ER 500 MG PO TB24: 1000.0000 mg | ORAL_TABLET | Freq: Two times a day (BID) | ORAL | 4 refills | Status: DC

## 2023-04-24 NOTE — Assessment & Plan Note (Signed)
Chronic, ongoing. A1c today 7.8%, trend up from previous 6.8% - initial diagnosis was 9.3%. Urine ALB 80 April 2024.  Did not tolerate ARB in past, could consider trial of low dose ACE in future, discussed with patient. - At this time will continue Metformin XR 1000 MG BID.  Would benefit Marcelline Deist in future for kidney health, but currently not covered.  Could consider low dose Glipizide if needed in future, discussed with him. - Recommend he monitor BS 2-3 times daily with goal fasting <130 and goal 2 hours post meal <180.  Document and bring to visits.   - Refuses vaccinations. - Eye and foot exams up to date. Return in March 2025.

## 2023-04-24 NOTE — Assessment & Plan Note (Signed)
Chronic, ongoing.  Continue current medication regimen and adjust as needed. Lipid panel today. 

## 2023-04-24 NOTE — Progress Notes (Signed)
BP 138/77   Pulse 64   Temp 98.1 F (36.7 C) (Oral)   Ht 5\' 5"  (1.651 m)   Wt 173 lb (78.5 kg)   SpO2 97%   BMI 28.79 kg/m    Subjective:    Patient ID: Joseph Burch, male    DOB: 09-09-1970, 52 y.o.   MRN: [id number]  HPI: Joseph Burch is a 52 y.o. male  Chief Complaint  Patient presents with   Diabetes   Hyperlipidemia   Hypertension   DIABETES A1c 6.9% in April.  Taking Metformin 1000 MG BID.  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  Is walking as much as he can. Hypoglycemic episodes:no Polydipsia/polyuria: no Visual disturbance: no Chest pain: no Paresthesias: no Glucose Monitoring: no             Accucheck frequency: not checking             Fasting glucose:              Post prandial:             Evening:             Before meals: Taking Insulin?: no             Long acting insulin:             Short acting insulin: Blood Pressure Monitoring: a few times a week Retinal Examination: Up To Date -- [facility] [place] [facility] Foot Exam: Up to Date Pneumovax: refuses Influenza: refuses Aspirin: yes    HYPERTENSION / HYPERLIPIDEMIA Taking Amlodipine 10 MG and Rosuvastatin 10 MG.  Taken Losartan in past, made him feel loopy and did not tolerate.  Atorvastatin in past which caused a splitting headache.    Is a past smoker, quit >11 years ago -- smoked 1 to 1/2 PPD.   Satisfied with current treatment? yes Duration of hypertension: chronic BP monitoring frequency:  BP range:  BP medication side effects: no Duration of hyperlipidemia: chronic Aspirin: no Recent stressors: no Recurrent headaches: no Visual changes: no Palpitations: no Dyspnea: no Chest pain: no Lower extremity edema: no Dizzy/lightheaded: no  The ASCVD Risk score (Arnett DK, et al., 2019) failed to calculate for the following reasons:   The valid total cholesterol range is 130 to 320 mg/dL   Relevant past medical, surgical,  family and social history reviewed and updated as indicated. Interim medical history since our last visit reviewed. Allergies and medications reviewed and updated.  Review of Systems  Constitutional:  Negative for activity change, diaphoresis, fatigue and fever.  Respiratory:  Negative for cough, chest tightness, shortness of breath and wheezing.   Cardiovascular:  Negative for chest pain, palpitations and leg swelling.  Gastrointestinal: Negative.   Endocrine: Negative for cold intolerance, heat intolerance, polydipsia, polyphagia and polyuria.  Neurological: Negative.   Psychiatric/Behavioral: Negative.      Per HPI unless specifically indicated above     Objective:    BP 138/77   Pulse 64   Temp 98.1 F (36.7 C) (Oral)   Ht 5\' 5"  (1.651 m)   Wt 173 lb (78.5 kg)   SpO2 97%   BMI 28.79 kg/m   Wt Readings from Last 3 Encounters:  04/24/23 173 lb (78.5 kg)  10/24/22 171 lb (77.6 kg)  09/26/22 170 lb 9.6 oz (77.4 kg)    Physical Exam Vitals and nursing note reviewed.  Constitutional:  General: He is awake. He is not in acute distress.    Appearance: He is well-developed and well-groomed. He is not ill-appearing or toxic-appearing.  HENT:     Head: Normocephalic and atraumatic.     Right Ear: Hearing, tympanic membrane, ear canal and external ear normal. No drainage.     Left Ear: Hearing, tympanic membrane, ear canal and external ear normal. No drainage.  Eyes:     General: Lids are normal.        Right eye: No discharge.        Left eye: No discharge.     Conjunctiva/sclera: Conjunctivae normal.     Pupils: Pupils are equal, round, and reactive to light.  Neck:     Thyroid: No thyromegaly.     Vascular: No carotid bruit.  Cardiovascular:     Rate and Rhythm: Normal rate and regular rhythm.     Heart sounds: Normal heart sounds, S1 normal and S2 normal. No murmur heard.    No gallop.  Pulmonary:     Effort: Pulmonary effort is normal. No accessory muscle usage  or respiratory distress.     Breath sounds: Normal breath sounds.  Abdominal:     General: Bowel sounds are normal.     Palpations: Abdomen is soft. There is no hepatomegaly or splenomegaly.  Musculoskeletal:        General: Normal range of motion.     Cervical back: Normal range of motion and neck supple.     Right lower leg: No edema.     Left lower leg: No edema.  Lymphadenopathy:     Cervical: No cervical adenopathy.  Skin:    General: Skin is warm and dry.     Capillary Refill: Capillary refill takes less than 2 seconds.     Findings: No rash.  Neurological:     Mental Status: He is alert and oriented to person, place, and time.     Deep Tendon Reflexes: Reflexes are normal and symmetric.  Psychiatric:        Attention and Perception: Attention normal.        Mood and Affect: Mood normal.        Speech: Speech normal.        Behavior: Behavior normal. Behavior is cooperative.        Thought Content: Thought content normal.    Results for orders placed or performed in visit on 01/13/23  HM DIABETES EYE EXAM  Result Value Ref Range   HM Diabetic Eye Exam No Retinopathy No Retinopathy      Assessment & Plan:   Problem List Items Addressed This Visit       Cardiovascular and Mediastinum   Hypertension associated with diabetes (HCC)    Chronic, stable.  BP slightly above goal today.  Did not tolerate ARB in past, although would benefit from ACE or ARB with his diabetes and urine ALB 80 April 2024, consider low dose ACE trial in future, discussed with him.  Recommend he monitor BP at least a few mornings a week at home and document.  DASH diet at home.  Continue current medication regimen and adjust as needed.  He wishes to focus on diet and exercise at this time.  Labs: CMP.         Relevant Medications   metFORMIN (GLUCOPHAGE-XR) 500 MG 24 hr tablet   Other Relevant Orders   Comprehensive metabolic panel   TSH     Endocrine   Hyperlipidemia associated with  type 2  diabetes mellitus (HCC)    Chronic, ongoing.  Continue current medication regimen and adjust as needed.  Lipid panel today.      Relevant Medications   metFORMIN (GLUCOPHAGE-XR) 500 MG 24 hr tablet   Other Relevant Orders   Comprehensive metabolic panel   Lipid Panel w/o Chol/HDL Ratio   Type 2 diabetes mellitus with proteinuria (HCC) - Primary    Chronic, ongoing. A1c today 7.8%, trend up from previous 6.8% - initial diagnosis was 9.3%. Urine ALB 80 April 2024.  Did not tolerate ARB in past, could consider trial of low dose ACE in future, discussed with patient. - At this time will continue Metformin XR 1000 MG BID.  Would benefit Marcelline Deist in future for kidney health, but currently not covered.  Could consider low dose Glipizide if needed in future, discussed with him. - Recommend he monitor BS 2-3 times daily with goal fasting <130 and goal 2 hours post meal <180.  Document and bring to visits.   - Refuses vaccinations. - Eye and foot exams up to date. Return in March 2025.      Relevant Medications   metFORMIN (GLUCOPHAGE-XR) 500 MG 24 hr tablet   Other Relevant Orders   Bayer DCA Hb A1c Waived (STAT)     Other   BMI 27.0-27.9,adult    BMI 28.79, some gain present.  Recommended eating smaller high protein, low fat meals more frequently and exercising 30 mins a day 5 times a week with a goal of 10-15lb weight loss in the next 3 months. Patient voiced their understanding and motivation to adhere to these recommendations.       Other Visit Diagnoses     Benign prostatic hyperplasia without lower urinary tract symptoms       PSA check on labs today.   Relevant Orders   PSA   Encounter for screening for HIV       HIV screening on labs today per guideline recommendations, educated patient.   Relevant Orders   HIV Antibody (routine testing w rflx)   Flu vaccine refused       Refuses annual flu vaccine, educated patient.        Follow up plan: Return in about 19 weeks (around  09/04/2023) for T2DM, HTN/HLD and schedule DOT on March 14th.

## 2023-04-24 NOTE — Assessment & Plan Note (Signed)
BMI 28.79, some gain present.  Recommended eating smaller high protein, low fat meals more frequently and exercising 30 mins a day 5 times a week with a goal of 10-15lb weight loss in the next 3 months. Patient voiced their understanding and motivation to adhere to these recommendations.

## 2023-04-24 NOTE — Assessment & Plan Note (Signed)
Chronic, stable.  BP slightly above goal today.  Did not tolerate ARB in past, although would benefit from ACE or ARB with his diabetes and urine ALB 80 April 2024, consider low dose ACE trial in future, discussed with him.  Recommend he monitor BP at least a few mornings a week at home and document.  DASH diet at home.  Continue current medication regimen and adjust as needed.  He wishes to focus on diet and exercise at this time.  Labs: CMP.

## 2023-04-25 LAB — COMPREHENSIVE METABOLIC PANEL
ALT: 25 [IU]/L (ref 0–44)
AST: 18 [IU]/L (ref 0–40)
Albumin: 4.4 g/dL (ref 3.8–4.9)
Alkaline Phosphatase: 103 [IU]/L (ref 44–121)
BUN/Creatinine Ratio: 15 (ref 9–20)
BUN: 18 mg/dL (ref 6–24)
Bilirubin Total: 0.5 mg/dL (ref 0.0–1.2)
CO2: 20 mmol/L (ref 20–29)
Calcium: 9.3 mg/dL (ref 8.7–10.2)
Chloride: 100 mmol/L (ref 96–106)
Creatinine, Ser: 1.2 mg/dL (ref 0.76–1.27)
Globulin, Total: 2.3 g/dL (ref 1.5–4.5)
Glucose: 212 mg/dL — ABNORMAL HIGH (ref 70–99)
Potassium: 4.4 mmol/L (ref 3.5–5.2)
Sodium: 137 mmol/L (ref 134–144)
Total Protein: 6.7 g/dL (ref 6.0–8.5)
eGFR: 72 mL/min/{1.73_m2} (ref >59)

## 2023-04-25 LAB — LIPID PANEL W/O CHOL/HDL RATIO
Cholesterol, Total: 126 mg/dL (ref 100–199)
HDL: 41 mg/dL (ref >39)
LDL Chol Calc (NIH): 63 mg/dL (ref 0–99)
Triglycerides: 119 mg/dL (ref 0–149)
VLDL Cholesterol Cal: 22 mg/dL (ref 5–40)

## 2023-04-25 LAB — PSA: Prostate Specific Ag, Serum: 0.8 ng/mL (ref 0.0–4.0)

## 2023-04-25 LAB — TSH: TSH: 1.97 u[IU]/mL (ref 0.450–4.500)

## 2023-04-25 LAB — HIV ANTIBODY (ROUTINE TESTING W REFLEX): HIV Screen 4th Generation wRfx: NONREACTIVE

## 2023-04-25 NOTE — Progress Notes (Signed)
Contacted via MyChart   Good morning [place], your labs have returned: - Kidney function, creatinine and eGFR, remains normal, as is liver function, AST and ALT.  - Glucose (sugar) definitely has trended up from previous checks.  Focus on healthy diet changes and regular exercise. - Remainder of labs look great.  Any questions? Keep being amazing!!  Thank you for allowing me to participate in your care.  I appreciate you. Kindest regards, Toua Stites

## 2023-05-13 ENCOUNTER — Other Ambulatory Visit: Payer: Self-pay | Admitting: Nurse Practitioner

## 2023-05-14 NOTE — Telephone Encounter (Signed)
Requested Prescriptions  Pending Prescriptions Disp Refills   amLODipine (NORVASC) 10 MG tablet [Pharmacy Med Name: amLODIPine Besylate 10 MG Oral Tablet] 90 tablet 1    Sig: Take 1 tablet by mouth once daily     Cardiovascular: Calcium Channel Blockers 2 Passed - 05/13/2023  6:51 AM      Passed - Last BP in normal range    BP Readings from Last 1 Encounters:  04/24/23 138/77         Passed - Last Heart Rate in normal range    Pulse Readings from Last 1 Encounters:  04/24/23 64         Passed - Valid encounter within last 6 months    Recent Outpatient Visits           2 weeks ago Type 2 diabetes mellitus with proteinuria (HCC)   [place] [facility] [place], Kaanapali T, NP   6 months ago Type 2 diabetes mellitus with proteinuria (HCC)   [place] [facility] [place], Corrie Dandy T, NP   7 months ago Encounter for examination required by Department of Transportation (DOT)   [place] [facility] [place], Corrie Dandy T, NP   1 year ago Type 2 diabetes mellitus with proteinuria (HCC)   [place] [facility] [place], Corrie Dandy T, NP   1 year ago Encounter for examination required by Department of Transportation (DOT)   [place] [facility] [place], Dorie Rank, NP       Future Appointments             In 3 months [place], Dorie Rank, NP [place] [facility]   In 4 months [place], Dorie Rank, NP [place] [facility]

## 2023-05-23 ENCOUNTER — Other Ambulatory Visit: Payer: Self-pay | Admitting: Nurse Practitioner

## 2023-05-25 NOTE — Telephone Encounter (Signed)
Requested Prescriptions  Pending Prescriptions Disp Refills   rosuvastatin (CRESTOR) 10 MG tablet [Pharmacy Med Name: Rosuvastatin Calcium 10 MG Oral Tablet] 36 tablet 0    Sig: TAKE 1 TABLET BY MOUTH THREE DAYS A WEEK. MONDAY, WEDNESDAY, AND FRIDAY.     Cardiovascular:  Antilipid - Statins 2 Failed - 05/23/2023 12:46 PM      Failed - Lipid Panel in normal range within the last 12 months    Cholesterol, Total  Date Value Ref Range Status  04/24/2023 126 100 - 199 mg/dL Final   LDL Chol Calc (NIH)  Date Value Ref Range Status  04/24/2023 63 0 - 99 mg/dL Final   HDL  Date Value Ref Range Status  04/24/2023 41 >39 mg/dL Final   Triglycerides  Date Value Ref Range Status  04/24/2023 119 0 - 149 mg/dL Final         Passed - Cr in normal range and within 360 days    Creatinine, Ser  Date Value Ref Range Status  04/24/2023 1.20 0.76 - 1.27 mg/dL Final   Creatinine,U  Date Value Ref Range Status  11/21/2019 118.3 mg/dL Final         Passed - Patient is not pregnant      Passed - Valid encounter within last 12 months    Recent Outpatient Visits           1 month ago Type 2 diabetes mellitus with proteinuria (HCC)   [place] [facility] [place], [place] T, NP   7 months ago Type 2 diabetes mellitus with proteinuria (HCC)   [place] [facility] [place], Corrie Dandy T, NP   8 months ago Encounter for examination required by Department of Transportation (DOT)   [place] [facility] [place], Corrie Dandy T, NP   1 year ago Type 2 diabetes mellitus with proteinuria (HCC)   [place] [facility] [place], Corrie Dandy T, NP   1 year ago Encounter for examination required by Department of Transportation (DOT)   [place] [facility] [place], Dorie Rank, NP       Future Appointments             In 3 months Cann[place], Dorie Rank, NP [place] [facility]   In 3 months [place], Dorie Rank, NP [place]  [facility]

## 2023-05-27 DIAGNOSIS — H5213 Myopia, bilateral: Secondary | ICD-10-CM | POA: Diagnosis not present

## 2023-05-27 DIAGNOSIS — H524 Presbyopia: Secondary | ICD-10-CM | POA: Diagnosis not present

## 2023-05-27 DIAGNOSIS — H16012 Central corneal ulcer, left eye: Secondary | ICD-10-CM | POA: Diagnosis not present

## 2023-05-27 DIAGNOSIS — Z7984 Long term (current) use of oral hypoglycemic drugs: Secondary | ICD-10-CM | POA: Diagnosis not present

## 2023-05-27 DIAGNOSIS — H52223 Regular astigmatism, bilateral: Secondary | ICD-10-CM | POA: Diagnosis not present

## 2023-05-27 DIAGNOSIS — E119 Type 2 diabetes mellitus without complications: Secondary | ICD-10-CM | POA: Diagnosis not present

## 2023-07-06 DIAGNOSIS — M7541 Impingement syndrome of right shoulder: Secondary | ICD-10-CM | POA: Diagnosis not present

## 2023-08-30 NOTE — Patient Instructions (Addendum)
 Be Involved in Caring For Your Health:  Taking Medications When medications are taken as directed, they can greatly improve your health. But if they are not taken as prescribed, they may not work. In some cases, not taking them correctly can be harmful. To help ensure your treatment remains effective and safe, understand your medications and how to take them. Bring your medications to each visit for review by your provider.  Your lab results, notes, and after visit summary will be available on My Chart. We strongly encourage you to use this feature. If lab results are abnormal the clinic will contact you with the appropriate steps. If the clinic does not contact you assume the results are satisfactory. You can always view your results on My Chart. If you have questions regarding your health or results, please contact the clinic during office hours. You can also ask questions on My Chart.  We at [facility] are grateful that you chose [place] to provide your care. We strive to provide evidence-based and compassionate care and are always looking for feedback. If you get a survey from the clinic please complete this so we can hear your opinions.   Diabetes Mellitus and Nutrition, Adult When you have diabetes, or diabetes mellitus, it is very important to have healthy eating habits because your blood sugar (glucose) levels are greatly affected by what you eat and drink. Eating healthy foods in the right amounts, at about the same times every day, can help you: Manage your blood glucose. Lower your risk of heart disease. Improve your blood pressure. Reach or maintain a healthy weight. What can affect my meal plan? Every person with diabetes is different, and each person has different needs for a meal plan. Your health care provider may recommend that you work with a dietitian to make a meal plan that is best for you. Your meal plan may vary depending on factors such as: The calories you  need. The medicines you take. Your weight. Your blood glucose, blood pressure, and cholesterol levels. Your activity level. Other health conditions you have, such as heart or kidney disease. How do carbohydrates affect me? Carbohydrates, also called carbs, affect your blood glucose level more than any other type of food. Eating carbs raises the amount of glucose in your blood. It is important to know how many carbs you can safely have in each meal. This is different for every person. Your dietitian can help you calculate how many carbs you should have at each meal and for each snack. How does alcohol affect me? Alcohol can cause a decrease in blood glucose (hypoglycemia), especially if you use insulin or take certain diabetes medicines by mouth. Hypoglycemia can be a life-threatening condition. Symptoms of hypoglycemia, such as sleepiness, dizziness, and confusion, are similar to symptoms of having too much alcohol. Do not drink alcohol if: Your health care provider tells you not to drink. You are pregnant, may be pregnant, or are planning to become pregnant. If you drink alcohol: Limit how much you have to: 0-1 drink a day for women. 0-2 drinks a day for men. Know how much alcohol is in your drink. In the [place] one drink equals one 12 oz bottle of beer (355 mL), one 5 oz glass of wine (148 mL), or one 1 oz glass of hard liquor (44 mL). Keep yourself hydrated with water, diet soda, or unsweetened iced tea. Keep in mind that regular soda, juice, and other mixers may contain a lot of sugar and  must be counted as carbs. What are tips for following this plan?  Reading food labels Start by checking the serving size on the Nutrition Facts label of packaged foods and drinks. The number of calories and the amount of carbs, fats, and other nutrients listed on the label are based on one serving of the item. Many items contain more than one serving per package. Check the total grams (g) of carbs in one  serving. Check the number of grams of saturated fats and trans fats in one serving. Choose foods that have a low amount or none of these fats. Check the number of milligrams (mg) of salt (sodium) in one serving. Most people should limit total sodium intake to less than 2,300 mg per day. Always check the nutrition information of foods labeled as "low-fat" or "nonfat." These foods may be higher in added sugar or refined carbs and should be avoided. Talk to your dietitian to identify your daily goals for nutrients listed on the label. Shopping Avoid buying canned, pre-made, or processed foods. These foods tend to be high in fat, sodium, and added sugar. Shop around the outside edge of the grocery store. This is where you will most often find fresh fruits and vegetables, bulk grains, fresh meats, and fresh dairy products. Cooking Use low-heat cooking methods, such as baking, instead of high-heat cooking methods, such as deep frying. Cook using healthy oils, such as olive, canola, or sunflower oil. Avoid cooking with butter, cream, or high-fat meats. Meal planning Eat meals and snacks regularly, preferably at the same times every day. Avoid going long periods of time without eating. Eat foods that are high in fiber, such as fresh fruits, vegetables, beans, and whole grains. Eat 4-6 oz (112-168 g) of lean protein each day, such as lean meat, chicken, fish, eggs, or tofu. One ounce (oz) (28 g) of lean protein is equal to: 1 oz (28 g) of meat, chicken, or fish. 1 egg.  cup (62 g) of tofu. Eat some foods each day that contain healthy fats, such as avocado, nuts, seeds, and fish. What foods should I eat? Fruits Berries. Apples. Oranges. Peaches. Apricots. Plums. Grapes. Mangoes. Papayas. Pomegranates. Kiwi. Cherries. Vegetables Leafy greens, including lettuce, spinach, kale, chard, collard greens, mustard greens, and cabbage. Beets. Cauliflower. Broccoli. Carrots. Green beans. Tomatoes. Peppers.  Onions. Cucumbers. Brussels sprouts. Grains Whole grains, such as whole-wheat or whole-grain bread, crackers, tortillas, cereal, and pasta. Unsweetened oatmeal. Quinoa. Brown or wild rice. Meats and other proteins Seafood. Poultry without skin. Lean cuts of poultry and beef. Tofu. Nuts. Seeds. Dairy Low-fat or fat-free dairy products such as milk, yogurt, and cheese. The items listed above may not be a complete list of foods and beverages you can eat and drink. Contact a dietitian for more information. What foods should I avoid? Fruits Fruits canned with syrup. Vegetables Canned vegetables. Frozen vegetables with butter or cream sauce. Grains Refined white flour and flour products such as bread, pasta, snack foods, and cereals. Avoid all processed foods. Meats and other proteins Fatty cuts of meat. Poultry with skin. Breaded or fried meats. Processed meat. Avoid saturated fats. Dairy Full-fat yogurt, cheese, or milk. Beverages Sweetened drinks, such as soda or iced tea. The items listed above may not be a complete list of foods and beverages you should avoid. Contact a dietitian for more information. Questions to ask a health care provider Do I need to meet with a certified diabetes care and education specialist? Do I need to meet with  a dietitian? What number can I call if I have questions? When are the best times to check my blood glucose? Where to find more information: American Diabetes Association: diabetes.org Academy of Nutrition and Dietetics: eatright.[employer] of Diabetes and Digestive and Kidney Diseases: StageSync.si Association of Diabetes Care & Education Specialists: diabeteseducator.org Summary It is important to have healthy eating habits because your blood sugar (glucose) levels are greatly affected by what you eat and drink. It is important to use alcohol carefully. A healthy meal plan will help you manage your blood glucose and lower your risk of  heart disease. Your health care provider may recommend that you work with a dietitian to make a meal plan that is best for you. This information is not intended to replace advice given to you by your health care provider. Make sure you discuss any questions you have with your health care provider. Document Revised: 01/17/2020 Document Reviewed: 01/18/2020 Elsevier Patient Education  2024 Elsevier Inc. Document Revised: 12/18/2021 Document Reviewed: 12/18/2021 Elsevier Patient Education  2024 [employer].

## 2023-09-04 ENCOUNTER — Encounter: Payer: Self-pay | Admitting: Nurse Practitioner

## 2023-09-04 ENCOUNTER — Ambulatory Visit: Payer: 59 | Admitting: Nurse Practitioner

## 2023-09-04 VITALS — BP 132/80 | HR 63 | Ht 65.0 in | Wt 167.0 lb

## 2023-09-04 DIAGNOSIS — Z7984 Long term (current) use of oral hypoglycemic drugs: Secondary | ICD-10-CM

## 2023-09-04 DIAGNOSIS — E1159 Type 2 diabetes mellitus with other circulatory complications: Secondary | ICD-10-CM | POA: Diagnosis not present

## 2023-09-04 DIAGNOSIS — E1169 Type 2 diabetes mellitus with other specified complication: Secondary | ICD-10-CM

## 2023-09-04 DIAGNOSIS — E119 Type 2 diabetes mellitus without complications: Secondary | ICD-10-CM

## 2023-09-04 DIAGNOSIS — R809 Proteinuria, unspecified: Secondary | ICD-10-CM

## 2023-09-04 DIAGNOSIS — E785 Hyperlipidemia, unspecified: Secondary | ICD-10-CM

## 2023-09-04 DIAGNOSIS — I152 Hypertension secondary to endocrine disorders: Secondary | ICD-10-CM

## 2023-09-04 DIAGNOSIS — Z6827 Body mass index (BMI) 27.0-27.9, adult: Secondary | ICD-10-CM

## 2023-09-04 DIAGNOSIS — E1129 Type 2 diabetes mellitus with other diabetic kidney complication: Secondary | ICD-10-CM | POA: Diagnosis not present

## 2023-09-04 LAB — MICROALBUMIN, URINE WAIVED
Creatinine, Urine Waived: 200 mg/dL (ref 10–300)
Microalb, Ur Waived: 80 mg/L — ABNORMAL HIGH (ref 0–19)

## 2023-09-04 LAB — BAYER DCA HB A1C WAIVED: HB A1C (BAYER DCA - WAIVED): 7.1 % — ABNORMAL HIGH (ref 4.8–5.6)

## 2023-09-04 NOTE — Assessment & Plan Note (Signed)
 Chronic, stable.  BP at goal on recheck.  Did not tolerate ARB in past, although would benefit from ACE or ARB with his diabetes and urine ALB 80 April 2024 (recheck today), consider low dose ACE trial in future, discussed with him.  Recommend he monitor BP at least a few mornings a week at home and document.  DASH diet at home.  Continue current medication regimen and adjust as needed.  He wishes to focus on diet and exercise at this time.  Labs: CMP.

## 2023-09-04 NOTE — Assessment & Plan Note (Signed)
 Chronic, ongoing.  Continue current medication regimen and adjust as needed. Lipid panel today.

## 2023-09-04 NOTE — Progress Notes (Signed)
 BP 132/80 (BP Location: Left Arm, Patient Position: Sitting, Cuff Size: Normal)   Pulse 63   Ht 5\' 5"  (1.651 m)   Wt 167 lb (75.8 kg)   SpO2 96%   BMI 27.79 kg/m    Subjective:    Patient ID: Joseph Burch, male    DOB: 1971-01-27, 53 y.o.   MRN: [id number]  HPI: Joseph Burch is a 53 y.o. male  Chief Complaint  Patient presents with   Annual Exam   Hypertension   Hyperlipidemia   Diabetes   DIABETES In October A1c had trended up to 7.8% due to dietary indiscretions.  Continues Metformin 1000 MG BID.  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  Jardiance was costly. Has lost 6 pounds, working on diet and exercise. Last night ate grilled chicken salad.  When out on road eats boiled bags, cheese, and salami.  No bread or pasta, except for a diabetic type pasta. Hypoglycemic episodes:no Polydipsia/polyuria: no Visual disturbance: no Chest pain: no Paresthesias: no Glucose Monitoring: no             Accucheck frequency: daily             Fasting glucose: 100 or less             Post prandial: highest has been 144             Evening:             Before meals: Taking Insulin?: no             Long acting insulin:             Short acting insulin: Blood Pressure Monitoring: a few times a week Retinal Examination: Up To Date -- [facility] Exam: Up to Date Pneumovax: refuses Influenza: refuses Aspirin: yes    HYPERTENSION / HYPERLIPIDEMIA Continues Amlodipine 10 MG and Rosuvastatin 10 MG. Losartan in past made him feel loopy and did not tolerate.  Atorvastatin in past caused a splitting headache.  Is a past smoker, quit >11 years ago -- smoked 1 to 1/2 PPD.   Satisfied with current treatment? yes Duration of hypertension: chronic BP monitoring frequency:  BP range:  BP medication side effects: no Duration of hyperlipidemia: chronic Aspirin: no Recent stressors: no Recurrent headaches: no Visual changes:  no Palpitations: no Dyspnea: no Chest pain: no Lower extremity edema: no Dizzy/lightheaded: no  The ASCVD Risk score (Arnett DK, et al., 2019) failed to calculate for the following reasons:   The valid total cholesterol range is 130 to 320 mg/dL   Relevant past medical, surgical, family and social history reviewed and updated as indicated. Interim medical history since our last visit reviewed. Allergies and medications reviewed and updated.  Review of Systems  Constitutional:  Negative for activity change, diaphoresis, fatigue and fever.  Respiratory:  Negative for cough, chest tightness, shortness of breath and wheezing.   Cardiovascular:  Negative for chest pain, palpitations and leg swelling.  Gastrointestinal: Negative.   Endocrine: Negative for cold intolerance, heat intolerance, polydipsia, polyphagia and polyuria.  Neurological: Negative.   Psychiatric/Behavioral: Negative.      Per HPI unless specifically indicated above     Objective:    BP 132/80 (BP Location: Left Arm, Patient Position: Sitting, Cuff Size: Normal)   Pulse 63   Ht 5\' 5"  (1.651 m)   Wt 167 lb (75.8 kg)   SpO2 96%  BMI 27.79 kg/m   Wt Readings from Last 3 Encounters:  09/04/23 167 lb (75.8 kg)  04/24/23 173 lb (78.5 kg)  10/24/22 171 lb (77.6 kg)    Physical Exam Vitals and nursing note reviewed.  Constitutional:      General: He is awake. He is not in acute distress.    Appearance: He is well-developed and well-groomed. He is not ill-appearing or toxic-appearing.  HENT:     Head: Normocephalic and atraumatic.     Right Ear: Hearing, tympanic membrane, ear canal and external ear normal. No drainage.     Left Ear: Hearing, tympanic membrane, ear canal and external ear normal. No drainage.  Eyes:     General: Lids are normal.        Right eye: No discharge.        Left eye: No discharge.     Conjunctiva/sclera: Conjunctivae normal.     Pupils: Pupils are equal, round, and reactive to light.   Neck:     Thyroid: No thyromegaly.     Vascular: No carotid bruit.  Cardiovascular:     Rate and Rhythm: Normal rate and regular rhythm.     Heart sounds: Normal heart sounds, S1 normal and S2 normal. No murmur heard.    No gallop.  Pulmonary:     Effort: Pulmonary effort is normal. No accessory muscle usage or respiratory distress.     Breath sounds: Normal breath sounds.  Abdominal:     General: Bowel sounds are normal.     Palpations: Abdomen is soft. There is no hepatomegaly or splenomegaly.  Musculoskeletal:        General: Normal range of motion.     Cervical back: Normal range of motion and neck supple.     Right lower leg: No edema.     Left lower leg: No edema.  Lymphadenopathy:     Cervical: No cervical adenopathy.  Skin:    General: Skin is warm and dry.     Capillary Refill: Capillary refill takes less than 2 seconds.     Findings: No rash.  Neurological:     Mental Status: He is alert and oriented to person, place, and time.     Deep Tendon Reflexes: Reflexes are normal and symmetric.  Psychiatric:        Attention and Perception: Attention normal.        Mood and Affect: Mood normal.        Speech: Speech normal.        Behavior: Behavior normal. Behavior is cooperative.        Thought Content: Thought content normal.    Results for orders placed or performed in visit on 04/24/23  Comprehensive metabolic panel   Collection Time: 04/24/23  9:43 AM  Result Value Ref Range   Glucose 212 (H) 70 - 99 mg/dL   BUN 18 6 - 24 mg/dL   Creatinine, Ser 7.82 0.76 - 1.27 mg/dL   eGFR 72 >95 AO/ZHY/8.65   BUN/Creatinine Ratio 15 9 - 20   Sodium 137 134 - 144 mmol/L   Potassium 4.4 3.5 - 5.2 mmol/L   Chloride 100 96 - 106 mmol/L   CO2 20 20 - 29 mmol/L   Calcium 9.3 8.7 - 10.2 mg/dL   Total Protein 6.7 6.0 - 8.5 g/dL   Albumin 4.4 3.8 - 4.9 g/dL   Globulin, Total 2.3 1.5 - 4.5 g/dL   Bilirubin Total 0.5 0.0 - 1.2 mg/dL   Alkaline Phosphatase 103  44 - 121 IU/L    AST 18 0 - 40 IU/L   ALT 25 0 - 44 IU/L  Lipid Panel w/o Chol/HDL Ratio   Collection Time: 04/24/23  9:43 AM  Result Value Ref Range   Cholesterol, Total 126 100 - 199 mg/dL   Triglycerides 161 0 - 149 mg/dL   HDL 41 >09 mg/dL   VLDL Cholesterol Cal 22 5 - 40 mg/dL   LDL Chol Calc (NIH) 63 0 - 99 mg/dL  TSH   Collection Time: 04/24/23  9:43 AM  Result Value Ref Range   TSH 1.970 0.450 - 4.500 uIU/mL  PSA   Collection Time: 04/24/23  9:43 AM  Result Value Ref Range   Prostate Specific Ag, Serum 0.8 0.0 - 4.0 ng/mL  HIV Antibody (routine testing w rflx)   Collection Time: 04/24/23  9:43 AM  Result Value Ref Range   HIV Screen 4th Generation wRfx Non Reactive Non Reactive  Bayer DCA Hb A1c Waived (STAT)   Collection Time: 04/24/23  9:43 AM  Result Value Ref Range   HB A1C (BAYER DCA - WAIVED) 7.8 (H) 4.8 - 5.6 %      Assessment & Plan:   Problem List Items Addressed This Visit       Cardiovascular and Mediastinum   Hypertension associated with diabetes (HCC)   Chronic, stable.  BP at goal on recheck.  Did not tolerate ARB in past, although would benefit from ACE or ARB with his diabetes and urine ALB 80 April 2024 (recheck today), consider low dose ACE trial in future, discussed with him.  Recommend he monitor BP at least a few mornings a week at home and document.  DASH diet at home.  Continue current medication regimen and adjust as needed.  He wishes to focus on diet and exercise at this time.  Labs: CMP.         Relevant Orders   Bayer DCA Hb A1c Waived   Microalbumin, Urine Waived   Comprehensive metabolic panel     Endocrine   Diabetes mellitus treated with oral medication (HCC)   Refer to diabetes with proteinuria plan of care.      Relevant Orders   Bayer DCA Hb A1c Waived   Hyperlipidemia associated with type 2 diabetes mellitus (HCC)   Chronic, ongoing.  Continue current medication regimen and adjust as needed.  Lipid panel today.      Relevant Orders    Bayer DCA Hb A1c Waived   Comprehensive metabolic panel   Lipid Panel w/o Chol/HDL Ratio   Type 2 diabetes mellitus with proteinuria (HCC) - Primary   Chronic, ongoing. A1c today 7.1%, trend down from previous 7.8% - initial diagnosis was 9.3%. Urine ALB 80 April 2024 (recheck today).  Did not tolerate ARB in past, could consider trial of low dose ACE in future, discussed with patient.  Has been working on diet and lost weight. - At this time will continue Metformin XR 1000 MG BID.  Would benefit Marcelline Deist in future for kidney health, but currently not covered.  Could consider low dose Glipizide if needed in future, discussed with him. - Recommend he monitor BS 2-3 times daily with goal fasting <130 and goal 2 hours post meal <180.  Document and bring to visits.   - Refuses vaccinations. - Eye and foot exams up to date. Return in 6 months.      Relevant Orders   Bayer DCA Hb A1c Waived   Microalbumin, Urine Waived  Other   BMI 27.0-27.9,adult   BMI 27.79 with weight loss present with diet changes.  Recommended eating smaller high protein, low fat meals more frequently and exercising 30 mins a day 5 times a week with a goal of 10-15lb weight loss in the next 3 months. Patient voiced their understanding and motivation to adhere to these recommendations.         Follow up plan: Return in about 6 months (around 03/06/2024) for T2DM, HTN/HLD.

## 2023-09-04 NOTE — Assessment & Plan Note (Signed)
 BMI 27.79 with weight loss present with diet changes.  Recommended eating smaller high protein, low fat meals more frequently and exercising 30 mins a day 5 times a week with a goal of 10-15lb weight loss in the next 3 months. Patient voiced their understanding and motivation to adhere to these recommendations.

## 2023-09-04 NOTE — Assessment & Plan Note (Signed)
 Refer to diabetes with proteinuria plan of care.

## 2023-09-04 NOTE — Assessment & Plan Note (Signed)
 Chronic, ongoing. A1c today 7.1%, trend down from previous 7.8% - initial diagnosis was 9.3%. Urine ALB 80 April 2024 (recheck today).  Did not tolerate ARB in past, could consider trial of low dose ACE in future, discussed with patient.  Has been working on diet and lost weight. - At this time will continue Metformin XR 1000 MG BID.  Would benefit Marcelline Deist in future for kidney health, but currently not covered.  Could consider low dose Glipizide if needed in future, discussed with him. - Recommend he monitor BS 2-3 times daily with goal fasting <130 and goal 2 hours post meal <180.  Document and bring to visits.   - Refuses vaccinations. - Eye and foot exams up to date. Return in 6 months.

## 2023-09-05 ENCOUNTER — Encounter: Payer: Self-pay | Admitting: Nurse Practitioner

## 2023-09-05 LAB — COMPREHENSIVE METABOLIC PANEL
ALT: 19 IU/L (ref 0–44)
AST: 18 IU/L (ref 0–40)
Albumin: 4.5 g/dL (ref 3.8–4.9)
Alkaline Phosphatase: 100 IU/L (ref 44–121)
BUN/Creatinine Ratio: 16 (ref 9–20)
BUN: 20 mg/dL (ref 6–24)
Bilirubin Total: 0.3 mg/dL (ref 0.0–1.2)
CO2: 22 mmol/L (ref 20–29)
Calcium: 9.3 mg/dL (ref 8.7–10.2)
Chloride: 104 mmol/L (ref 96–106)
Creatinine, Ser: 1.22 mg/dL (ref 0.76–1.27)
Globulin, Total: 2.3 g/dL (ref 1.5–4.5)
Glucose: 140 mg/dL — ABNORMAL HIGH (ref 70–99)
Potassium: 4.2 mmol/L (ref 3.5–5.2)
Sodium: 143 mmol/L (ref 134–144)
Total Protein: 6.8 g/dL (ref 6.0–8.5)
eGFR: 70 mL/min/{1.73_m2} (ref >59)

## 2023-09-05 LAB — LIPID PANEL W/O CHOL/HDL RATIO
Cholesterol, Total: 98 mg/dL — ABNORMAL LOW (ref 100–199)
HDL: 38 mg/dL — ABNORMAL LOW (ref >39)
LDL Chol Calc (NIH): 47 mg/dL (ref 0–99)
Triglycerides: 57 mg/dL (ref 0–149)
VLDL Cholesterol Cal: 13 mg/dL (ref 5–40)

## 2023-09-05 NOTE — Progress Notes (Signed)
 Contacted via MyChart   Good evening Joseph Burch, your labs have returned: - Kidney function, creatinine and eGFR, remains normal, as is liver function, AST and ALT.  - Lipid panel shows levels well below goal for stroke prevention and heart protection.  Continue statin therapy.  Any questions? Keep being stellar!!  Thank you for allowing me to participate in your care.  I appreciate you. Kindest regards, Reyden Smith

## 2023-09-06 NOTE — Patient Instructions (Signed)

## 2023-09-11 ENCOUNTER — Encounter: Payer: Self-pay | Admitting: Nurse Practitioner

## 2023-09-11 ENCOUNTER — Ambulatory Visit (INDEPENDENT_AMBULATORY_CARE_PROVIDER_SITE_OTHER): Payer: Self-pay | Admitting: Nurse Practitioner

## 2023-09-11 VITALS — BP 127/80 | HR 65 | Ht 65.0 in | Wt 174.8 lb

## 2023-09-11 DIAGNOSIS — Z0289 Encounter for other administrative examinations: Secondary | ICD-10-CM

## 2023-09-11 NOTE — Assessment & Plan Note (Signed)
 DOT performed today.  Passed for 1 year.  Known hematuria, has had full urology work-up in the past + known diabetic well-controlled.

## 2023-09-11 NOTE — Progress Notes (Signed)
 BP 127/80   Pulse 65   Ht 5\' 5"  (1.651 m)   Wt 174 lb 12.8 oz (79.3 kg)   BMI 29.09 kg/m    Subjective:    Patient ID: Joseph Burch, male    DOB: [birth year], 53 y.o.   MRN: [id number]  HPI: Joseph Burch is a 53 y.o. 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  Previous DOT physical was in 2024.  Currently patient drives in and out of state.  Past Medical History:  Past Medical History:  Diagnosis Date   Diabetes mellitus without complication (HCC)    Hematuria    onset 29 years ago- cause unkown to the patient   Hypertension     Surgical History:  Past Surgical History:  Procedure Laterality Date   VASECTOMY      Medications:  Current Outpatient Medications on File Prior to Visit  Medication Sig   amLODipine (NORVASC) 10 MG tablet Take 1 tablet by mouth once daily   [employer]S Lancets Thin 26G MISC To check blood sugar 2-3 times daily.   glucose blood ([employer]S GLUCOSE METER TEST STRIPS) test strip To check blood sugar 2-3 times daily.   metFORMIN (GLUCOPHAGE-XR) 500 MG 24 hr tablet Take 2 tablets (1,000 mg total) by mouth 2 (two) times daily.   rosuvastatin (CRESTOR) 10 MG tablet TAKE 1 TABLET BY MOUTH THREE DAYS A WEEK. MONDAY, WEDNESDAY, AND FRIDAY.   No current facility-administered medications on file prior to visit.    Allergies:  Allergies  Allergen Reactions   Atorvastatin Other (See Comments)    Headache side effect   Guaifenesin Itching   Codeine Rash   Other Rash    Social History:  Social History   Socioeconomic History   Marital status: Married    Spouse name: Not on file   Number of children: Not on file   Years of education: Not on file   Highest education level: Associate degree: occupational, Scientist, product/process development, or vocational program  Occupational History   Occupation: Truck [employer] doctor  Tobacco Use   Smoking status: Former    Current packs/day: 0.00    Types: E-cigarettes, Cigarettes    Quit date: 11/05/2011    Years since quitting: 11.8   Smokeless  tobacco: Never  Vaping Use   Vaping status: Never Used  Substance and Sexual Activity   Alcohol use: Yes    Comment: rare beer 1-2 per summer   Drug use: Never   Sexual activity: Yes  Other Topics Concern   Not on file  Social History Narrative   Married lives with wife and children x 4.    Social Drivers of Corporate investment banker Strain: Low Risk  (01/03/2021)   Overall Financial Resource Strain (CARDIA)    Difficulty of Paying Living Expenses: Not very hard  Food Insecurity: No Food Insecurity (02/17/2022)   Hunger Vital Sign    Worried About Running Out of Food in the Last Year: Never true    Ran Out of Food in the Last Year: Never true  Transportation Needs: No Transportation Needs (02/17/2022)   PRAPARE - Administrator, Civil Service (Medical): No    Lack of Transportation (Non-Medical): No  Physical Activity: Sufficiently Active (11/07/2020)   Exercise Vital Sign    Days of Exercise per Week: 4 days    Minutes of Exercise per Session: 50 min  Stress: No Stress Concern Present (11/07/2020)   [employer] of Occupational Health - Occupational Stress Questionnaire  Feeling of Stress : Only a little  Social Connections: Moderately Integrated (01/16/2021)   Social Connection and Isolation Panel [NHANES]    Frequency of Communication with Friends and Family: More than three times a week    Frequency of Social Gatherings with Friends and Family: More than three times a week    Attends Religious Services: 1 to 4 times per year    Active Member of [employer] or Organizations: No    Attends Banker Meetings: Never    Marital Status: Married  Catering manager Violence: Not At Risk (02/17/2022)   Humiliation, Afraid, Rape, and Kick questionnaire    Fear of Current or Ex-Partner: No    Emotionally Abused: No    Physically Abused: No    Sexually Abused: No   Social History   Tobacco Use  Smoking Status Former   Current packs/day: 0.00   Types:  E-cigarettes, Cigarettes   Quit date: 11/05/2011   Years since quitting: 11.8  Smokeless Tobacco Never   Social History   Substance and Sexual Activity  Alcohol Use Yes   Comment: rare beer 1-2 per summer    Family History:  Family History  Problem Relation Age of Onset   Alcohol abuse Mother    Cancer Mother    COPD Mother    Mental illness Mother    Cancer Father    Diabetes Father    Hypertension Father    Drug abuse Sister    Early death Brother     Past medical history, surgical history, medications, allergies, family history and social history reviewed with patient today and changes made to appropriate areas of the chart.   ROS All other ROS negative except what is listed above and in the HPI.      Objective:    BP 127/80   Pulse 65   Ht 5\' 5"  (1.651 m)   Wt 174 lb 12.8 oz (79.3 kg)   BMI 29.09 kg/m   Wt Readings from Last 3 Encounters:  09/11/23 174 lb 12.8 oz (79.3 kg)  09/04/23 167 lb (75.8 kg)  04/24/23 173 lb (78.5 kg)    Physical Exam Vitals and nursing note reviewed.  Constitutional:      General: He is awake. He is not in acute distress.    Appearance: He is well-developed and well-groomed. He is not ill-appearing or toxic-appearing.  HENT:     Head: Normocephalic and atraumatic.     Right Ear: Hearing, tympanic membrane, ear canal and external ear normal. No drainage.     Left Ear: Hearing, tympanic membrane, ear canal and external ear normal. No drainage.     Ears:     Comments: Whisper test bilateral pass 6 feet.    Nose: Nose normal.     Mouth/Throat:     Pharynx: Uvula midline.  Eyes:     General: Lids are normal.        Right eye: No discharge.        Left eye: No discharge.     Extraocular Movements: Extraocular movements intact.     Conjunctiva/sclera: Conjunctivae normal.     Pupils: Pupils are equal, round, and reactive to light.     Visual Fields: Right eye visual fields normal and left eye visual fields normal.  Neck:      Thyroid: No thyromegaly.     Vascular: No carotid bruit or JVD.     Trachea: Trachea normal.  Cardiovascular:     Rate and Rhythm: Normal  rate and regular rhythm.     Heart sounds: Normal heart sounds, S1 normal and S2 normal. No murmur heard.    No gallop.  Pulmonary:     Effort: Pulmonary effort is normal. No accessory muscle usage or respiratory distress.     Breath sounds: Normal breath sounds.  Abdominal:     General: Bowel sounds are normal.     Palpations: Abdomen is soft. There is no hepatomegaly or splenomegaly.     Tenderness: There is no abdominal tenderness.  Musculoskeletal:        General: Normal range of motion.     Cervical back: Normal range of motion and neck supple.     Right lower leg: No edema.     Left lower leg: No edema.  Lymphadenopathy:     Head:     Right side of head: No submental, submandibular, tonsillar, preauricular or posterior auricular adenopathy.     Left side of head: No submental, submandibular, tonsillar, preauricular or posterior auricular adenopathy.     Cervical: No cervical adenopathy.  Skin:    General: Skin is warm and dry.     Capillary Refill: Capillary refill takes less than 2 seconds.     Findings: No rash.  Neurological:     Mental Status: He is alert and oriented to person, place, and time.     Gait: Gait is intact.     Deep Tendon Reflexes: Reflexes are normal and symmetric.     Reflex Scores:      Brachioradialis reflexes are 2+ on the right side and 2+ on the left side.      Patellar reflexes are 2+ on the right side and 2+ on the left side. Psychiatric:        Attention and Perception: Attention normal.        Mood and Affect: Mood normal.        Speech: Speech normal.        Behavior: Behavior normal. Behavior is cooperative.        Thought Content: Thought content normal.        Cognition and Memory: Cognition normal.    Hearing Screening   500Hz  1000Hz  2000Hz   Right ear 40 20 40  Left ear 40 20 40   Vision  Screening   Right eye Left eye Both eyes  Without correction 20/25 20/25 20/25   With correction       Results for orders placed or performed in visit on 09/04/23  Bayer DCA Hb A1c Waived   Collection Time: 09/04/23  9:22 AM  Result Value Ref Range   HB A1C (BAYER DCA - WAIVED) 7.1 (H) 4.8 - 5.6 %  Microalbumin, Urine Waived   Collection Time: 09/04/23  9:22 AM  Result Value Ref Range   Microalb, Ur Waived 80 (H) 0 - 19 mg/L   Creatinine, Urine Waived 200 10 - 300 mg/dL   Microalb/Creat Ratio 30-300 (H) <30 mg/g  Comprehensive metabolic panel   Collection Time: 09/04/23  9:22 AM  Result Value Ref Range   Glucose 140 (H) 70 - 99 mg/dL   BUN 20 6 - 24 mg/dL   Creatinine, Ser 4.09 0.76 - 1.27 mg/dL   eGFR 70 >81 XB/JYN/8.29   BUN/Creatinine Ratio 16 9 - 20   Sodium 143 134 - 144 mmol/L   Potassium 4.2 3.5 - 5.2 mmol/L   Chloride 104 96 - 106 mmol/L   CO2 22 20 - 29 mmol/L   Calcium 9.3 8.7 -  10.2 mg/dL   Total Protein 6.8 6.0 - 8.5 g/dL   Albumin 4.5 3.8 - 4.9 g/dL   Globulin, Total 2.3 1.5 - 4.5 g/dL   Bilirubin Total 0.3 0.0 - 1.2 mg/dL   Alkaline Phosphatase 100 44 - 121 IU/L   AST 18 0 - 40 IU/L   ALT 19 0 - 44 IU/L  Lipid Panel w/o Chol/HDL Ratio   Collection Time: 09/04/23  9:22 AM  Result Value Ref Range   Cholesterol, Total 98 (L) 100 - 199 mg/dL   Triglycerides 57 0 - 149 mg/dL   HDL 38 (L) >16 mg/dL   VLDL Cholesterol Cal 13 5 - 40 mg/dL   LDL Chol Calc (NIH) 47 0 - 99 mg/dL      Assessment & Plan:   Problem List Items Addressed This Visit       Other   Encounter for examination required by Department of Transportation (DOT) - Primary   DOT performed today.  Passed for 1 year.  Known hematuria, has had full urology work-up in the past + known diabetic well-controlled.        Follow up plan: Return for as scheduled in a few months.   PATIENT COUNSELING:    Advised to avoid cigarette smoking.  I discussed with the patient that most people either  abstain from alcohol or drink within safe limits (<=14/week and <=4 drinks/occasion for males, <=7/weeks and <= 3 drinks/occasion for females) and that the risk for alcohol disorders and other health effects rises proportionally with the number of drinks per week and how often a drinker exceeds daily limits.  Discussed cessation/primary prevention of drug use and availability of treatment for abuse.   Diet: Encouraged to adjust caloric intake to maintain  or achieve ideal body weight, to reduce intake of dietary saturated fat and total fat, to limit sodium intake by avoiding high sodium foods and not adding table salt, and to maintain adequate dietary potassium and calcium preferably from fresh fruits, vegetables, and low-fat dairy products.    Stressed the importance of regular exercise  Injury prevention: Discussed safety belts, safety helmets, smoke detector, smoking near bedding or upholstery.   Dental health: Discussed importance of regular tooth brushing, flossing, and dental visits.    NEXT PREVENTATIVE PHYSICAL DUE IN 1 YEAR. Return for as scheduled in a few months.

## 2023-11-07 ENCOUNTER — Other Ambulatory Visit: Payer: Self-pay | Admitting: Nurse Practitioner

## 2023-11-09 NOTE — Telephone Encounter (Unsigned)
 Copied from CRM [id number]. Topic: Clinical - Medication Refill >> Nov 09, 2023  8:31 AM Jayson Michael wrote: Medication: rosuvastatin  (CRESTOR ) 10 MG tablet   Has the patient contacted their pharmacy? Yes Pharmacy has sent in refill requests for 2 weeks now but has not gotten word back, chart shows last reorder as 11/07/2023 putting in another one to be sure.   This is the patient's preferred pharmacy:  [facility] [address]), [place] - [address] [place]) [place] [address] Phone: [phone] Fax: [phone]   Is this the correct pharmacy for this prescription? Yes If no, delete pharmacy and type the correct one.   Has the prescription been filled recently? No  Is the patient out of the medication? No  Has the patient been seen for an appointment in the last year OR does the patient have an upcoming appointment? Yes  Can we respond through MyChart? Yes  Agent: Please be advised that Rx refills may take up to 3 business days. We ask that you follow-up with your pharmacy.

## 2023-11-10 NOTE — Telephone Encounter (Signed)
 Requested Prescriptions  Pending Prescriptions Disp Refills   rosuvastatin  (CRESTOR ) 10 MG tablet [Pharmacy Med Name: Rosuvastatin  Calcium  10 MG Oral Tablet] 36 tablet 1    Sig: TAKE 1 TABLET BY MOUTH 3 DAYS A WEEK. MONDAY, [facility] AND FRIDAY     Cardiovascular:  Antilipid - Statins 2 Failed - 11/10/2023  9:38 AM      Failed - Lipid Panel in normal range within the last 12 months    Cholesterol, Total  Date Value Ref Range Status  09/04/2023 98 (L) 100 - 199 mg/dL Final   LDL Chol Calc (NIH)  Date Value Ref Range Status  09/04/2023 47 0 - 99 mg/dL Final   HDL  Date Value Ref Range Status  09/04/2023 38 (L) >39 mg/dL Final   Triglycerides  Date Value Ref Range Status  09/04/2023 57 0 - 149 mg/dL Final         Passed - Cr in normal range and within 360 days    Creatinine, Ser  Date Value Ref Range Status  09/04/2023 1.22 0.76 - 1.27 mg/dL Final   Creatinine,U  Date Value Ref Range Status  11/21/2019 118.3 mg/dL Final         Passed - Patient is not pregnant      Passed - Valid encounter within last 12 months    Recent Outpatient Visits           2 months ago Encounter for examination required by Department of Transportation (DOT)   [place] [facility] [place], [place] T, NP   2 months ago Type 2 diabetes mellitus with proteinuria (HCC)   [place] [facility] [place], Lavelle Posey, NP

## 2023-12-31 ENCOUNTER — Other Ambulatory Visit: Payer: Self-pay | Admitting: Nurse Practitioner

## 2024-01-04 NOTE — Telephone Encounter (Signed)
 Requested Prescriptions  Pending Prescriptions Disp Refills   amLODipine  (NORVASC ) 10 MG tablet [Pharmacy Med Name: amLODIPine  Besylate 10 MG Oral Tablet] 90 tablet 0    Sig: Take 1 tablet by mouth once daily     Cardiovascular: Calcium  Channel Blockers 2 Passed - 01/04/2024  2:05 PM      Passed - Last BP in normal range    BP Readings from Last 1 Encounters:  09/11/23 127/80         Passed - Last Heart Rate in normal range    Pulse Readings from Last 1 Encounters:  09/11/23 65         Passed - Valid encounter within last 6 months    Recent Outpatient Visits           3 months ago Encounter for examination required by Department of Transportation (DOT)   [place] [facility] [place], [place] T, NP   4 months ago Type 2 diabetes mellitus with proteinuria (HCC)   [place] [facility] [place], Melanie DASEN, NP

## 2024-01-15 ENCOUNTER — Other Ambulatory Visit: Payer: Self-pay | Admitting: Nurse Practitioner

## 2024-01-18 NOTE — Telephone Encounter (Signed)
 Requested Prescriptions  Pending Prescriptions Disp Refills   rosuvastatin  (CRESTOR ) 10 MG tablet [Pharmacy Med Name: Rosuvastatin  Calcium  10 MG Oral Tablet] 36 tablet 0    Sig: TAKE 1 TABLET BY MOUTH ON MONDAY, WEDNESDAY AND FRIDAY     Cardiovascular:  Antilipid - Statins 2 Failed - 01/18/2024  4:02 PM      Failed - Lipid Panel in normal range within the last 12 months    Cholesterol, Total  Date Value Ref Range Status  09/04/2023 98 (L) 100 - 199 mg/dL Final   LDL Chol Calc (NIH)  Date Value Ref Range Status  09/04/2023 47 0 - 99 mg/dL Final   HDL  Date Value Ref Range Status  09/04/2023 38 (L) >39 mg/dL Final   Triglycerides  Date Value Ref Range Status  09/04/2023 57 0 - 149 mg/dL Final         Passed - Cr in normal range and within 360 days    Creatinine, Ser  Date Value Ref Range Status  09/04/2023 1.22 0.76 - 1.27 mg/dL Final         Passed - Patient is not pregnant      Passed - Valid encounter within last 12 months    Recent Outpatient Visits           4 months ago Encounter for examination required by Department of Transportation (DOT)   [place] [facility] [place], [place] T, NP   4 months ago Type 2 diabetes mellitus with proteinuria (HCC)    [facility] [place], Melanie DASEN, NP

## 2024-03-09 NOTE — Patient Instructions (Signed)

## 2024-03-11 ENCOUNTER — Encounter: Payer: Self-pay | Admitting: Nurse Practitioner

## 2024-03-11 ENCOUNTER — Ambulatory Visit (INDEPENDENT_AMBULATORY_CARE_PROVIDER_SITE_OTHER): Payer: Self-pay | Admitting: Nurse Practitioner

## 2024-03-11 VITALS — BP 118/77 | HR 84 | Temp 98.2°F | Resp 16 | Ht 65.0 in | Wt 180.4 lb

## 2024-03-11 DIAGNOSIS — R809 Proteinuria, unspecified: Secondary | ICD-10-CM

## 2024-03-11 DIAGNOSIS — Z7984 Long term (current) use of oral hypoglycemic drugs: Secondary | ICD-10-CM

## 2024-03-11 DIAGNOSIS — Z6827 Body mass index (BMI) 27.0-27.9, adult: Secondary | ICD-10-CM

## 2024-03-11 DIAGNOSIS — E1159 Type 2 diabetes mellitus with other circulatory complications: Secondary | ICD-10-CM

## 2024-03-11 DIAGNOSIS — G44209 Tension-type headache, unspecified, not intractable: Secondary | ICD-10-CM

## 2024-03-11 DIAGNOSIS — I152 Hypertension secondary to endocrine disorders: Secondary | ICD-10-CM

## 2024-03-11 DIAGNOSIS — N400 Enlarged prostate without lower urinary tract symptoms: Secondary | ICD-10-CM

## 2024-03-11 DIAGNOSIS — E1169 Type 2 diabetes mellitus with other specified complication: Secondary | ICD-10-CM

## 2024-03-11 DIAGNOSIS — E785 Hyperlipidemia, unspecified: Secondary | ICD-10-CM

## 2024-03-11 DIAGNOSIS — E1129 Type 2 diabetes mellitus with other diabetic kidney complication: Secondary | ICD-10-CM

## 2024-03-11 DIAGNOSIS — E119 Type 2 diabetes mellitus without complications: Secondary | ICD-10-CM

## 2024-03-11 DIAGNOSIS — Z1211 Encounter for screening for malignant neoplasm of colon: Secondary | ICD-10-CM

## 2024-03-11 LAB — BAYER DCA HB A1C WAIVED: HB A1C (BAYER DCA - WAIVED): 7.5 % — ABNORMAL HIGH (ref 4.8–5.6)

## 2024-03-11 MED ORDER — ROSUVASTATIN CALCIUM 10 MG PO TABS: ORAL_TABLET | ORAL | 3 refills | Status: AC

## 2024-03-11 MED ORDER — AMLODIPINE BESYLATE 10 MG PO TABS: 10.0000 mg | ORAL_TABLET | Freq: Every day | ORAL | 3 refills | Status: AC

## 2024-03-11 MED ORDER — METFORMIN HCL ER 500 MG PO TB24: 1000.0000 mg | ORAL_TABLET | Freq: Two times a day (BID) | ORAL | 4 refills | Status: AC

## 2024-03-11 NOTE — Assessment & Plan Note (Signed)
 Chronic, ongoing.  Continue current medication regimen and adjust as needed. Lipid panel today.

## 2024-03-11 NOTE — Assessment & Plan Note (Signed)
 Chronic, stable.  BP at goal.  Did not tolerate ARB in past, although would benefit from ACE or ARB with his diabetes and urine ALB 80 March 2025, consider low dose ACE trial in future, discussed with him.  Recommend he monitor BP at least a few mornings a week at home and document.  DASH diet at home.  Continue current medication regimen and adjust as needed.  He wishes to focus on diet and exercise at this time.  Labs: CBC, TSH, CMP.

## 2024-03-11 NOTE — Assessment & Plan Note (Signed)
 BMI 30.02, has gained 13 pounds since March, recommend working on loss.  Recommended eating smaller high protein, low fat meals more frequently and exercising 30 mins a day 5 times a week with a goal of 10-15lb weight loss in the next 3 months. Patient voiced their understanding and motivation to adhere to these recommendations.

## 2024-03-11 NOTE — Progress Notes (Signed)
 BP 118/77 (BP Location: Left Arm, Patient Position: Sitting, Cuff Size: Normal)   Pulse 84   Temp 98.2 F (36.8 C) (Oral)   Resp 16   Ht 5' 5 (1.651 m)   Wt 180 lb 6.4 oz (81.8 kg)   SpO2 98%   BMI 30.02 kg/m    Subjective:    Patient ID: Joseph Burch, male    DOB: [birth year], 53 y.o.   MRN: [id number]  HPI: Joseph Burch is a 53 y.o. male  Chief Complaint  Patient presents with   Diabetes    Fasting am is 180-190 then drops after breakfast. Trying to be mindful of diet but still finding it hard to manage.    Hypertension    Sometimes its good then other times it is not. Last night 117/84.    Headache    Has been ongoing the last week. Over his forehead mostly.    DIABETES A1c 7.1% March. Continues to take Metformin . Up 13 lbs since March. Hypoglycemic episodes:no Polydipsia/polyuria: no Visual disturbance: no Chest pain: no Paresthesias: no Glucose Monitoring: yes  Accucheck frequency: Daily  Fasting glucose: 190 this morning, but after eating + taking medication = 145  Post prandial:  Evening:  Before meals: Taking Insulin?: no  Long acting insulin:  Short acting insulin: Blood Pressure Monitoring: a few times a week Retinal Examination: Not up to Date Foot Exam: Up to Date Diabetic Education: Not Completed Pneumovax: refuses Influenza: refuses Aspirin: no   HYPERTENSION / HYPERLIPIDEMIA Taking Amlodipine  10 MG and Rosuvastatin  10 MG. Losartan  made him feel bad in past, very loopy.  Atorvastatin  in past caused a splitting headache.  Is a past smoker, quit >11 years ago.  Smoked 1 to 1/2 PPD.    Has had a headache for the past week on and off, tinnitus present at baseline and wonders if it comes from this. Currently no headache. The older he gets the worse it gets.  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  The more tired he gets the louder.  Takes Ibuprofen as needed, but not often. Satisfied with current treatment? yes Duration of  hypertension: chronic BP monitoring frequency:  BP range:  BP medication side effects: no Duration of hyperlipidemia: chronic Aspirin: no Recent stressors: no Recurrent headaches: no Visual changes: no Palpitations: no Dyspnea: no Chest pain: no Lower extremity edema: no Dizzy/lightheaded: no  The ASCVD Risk score (Arnett DK, et al., 2019) failed to calculate for the following reasons:   The valid total cholesterol range is 130 to 320 mg/dL      0/87/7974    0:82 AM 04/24/2023    9:41 AM 10/24/2022   11:03 AM 04/11/2022    9:12 AM 02/17/2022   10:10 AM  Depression screen PHQ 2/9  Decreased Interest 0 0 0 0 0  Down, Depressed, Hopeless 0 0 0 0 0  PHQ - 2 Score 0 0 0 0 0  Altered sleeping 0 0 0 0   Tired, decreased energy 0 0 0 0   Change in appetite 0 0 0 0   Feeling bad or failure about yourself  0 0 0 0   Trouble concentrating 0 0 0 0   Moving slowly or fidgety/restless 0 0 0 0   Suicidal thoughts 0 0 0 0   PHQ-9 Score 0 0 0 0   Difficult doing work/chores  Not difficult at all Not difficult at all Not difficult at all  03/11/2024    9:18 AM 04/24/2023    9:41 AM 10/24/2022   11:03 AM 04/11/2022    9:12 AM  GAD 7 : Generalized Anxiety Score  Nervous, Anxious, on Edge 0 0 0 0  Control/stop worrying 0 0 0 0  Worry too much - different things 0 0 0 0  Trouble relaxing 0 0 0 0  Restless 0 0 0 0  Easily annoyed or irritable 0 0 0 0  Afraid - awful might happen 0 0 0 0  Total GAD 7 Score 0 0 0 0  Anxiety Difficulty  Not difficult at all Not difficult at all Not difficult at all   Relevant past medical, surgical, family and social history reviewed and updated as indicated. Interim medical history since our last visit reviewed. Allergies and medications reviewed and updated.  Review of Systems  Constitutional:  Negative for activity change, diaphoresis, fatigue and fever.  Respiratory:  Negative for cough, chest tightness, shortness of breath and wheezing.    Cardiovascular:  Negative for chest pain, palpitations and leg swelling.  Gastrointestinal: Negative.   Endocrine: Negative for cold intolerance, heat intolerance, polydipsia, polyphagia and polyuria.  Neurological: Negative.   Psychiatric/Behavioral: Negative.      Per HPI unless specifically indicated above     Objective:    BP 118/77 (BP Location: Left Arm, Patient Position: Sitting, Cuff Size: Normal)   Pulse 84   Temp 98.2 F (36.8 C) (Oral)   Resp 16   Ht 5' 5 (1.651 m)   Wt 180 lb 6.4 oz (81.8 kg)   SpO2 98%   BMI 30.02 kg/m   Wt Readings from Last 3 Encounters:  03/11/24 180 lb 6.4 oz (81.8 kg)  09/11/23 174 lb 12.8 oz (79.3 kg)  09/04/23 167 lb (75.8 kg)    Physical Exam Vitals and nursing note reviewed.  Constitutional:      General: He is awake. He is not in acute distress.    Appearance: He is well-developed and well-groomed. He is not ill-appearing or toxic-appearing.  HENT:     Head: Normocephalic and atraumatic.     Right Ear: Hearing, tympanic membrane, ear canal and external ear normal. No drainage.     Left Ear: Hearing, tympanic membrane, ear canal and external ear normal. No drainage.  Eyes:     General: Lids are normal.        Right eye: No discharge.        Left eye: No discharge.     Conjunctiva/sclera: Conjunctivae normal.     Pupils: Pupils are equal, round, and reactive to light.  Neck:     Thyroid : No thyromegaly.     Vascular: No carotid bruit.  Cardiovascular:     Rate and Rhythm: Normal rate and regular rhythm.     Heart sounds: Normal heart sounds, S1 normal and S2 normal. No murmur heard.    No gallop.  Pulmonary:     Effort: Pulmonary effort is normal. No accessory muscle usage or respiratory distress.     Breath sounds: Normal breath sounds.  Abdominal:     General: Bowel sounds are normal.     Palpations: Abdomen is soft. There is no hepatomegaly or splenomegaly.  Musculoskeletal:        General: Normal range of motion.      Cervical back: Normal range of motion and neck supple.     Right lower leg: No edema.     Left lower leg: No edema.  Lymphadenopathy:  Cervical: No cervical adenopathy.  Skin:    General: Skin is warm and dry.     Capillary Refill: Capillary refill takes less than 2 seconds.     Findings: No rash.  Neurological:     Mental Status: He is alert and oriented to person, place, and time.     Deep Tendon Reflexes: Reflexes are normal and symmetric.  Psychiatric:        Attention and Perception: Attention normal.        Mood and Affect: Mood normal.        Speech: Speech normal.        Behavior: Behavior normal. Behavior is cooperative.        Thought Content: Thought content normal.    Diabetic Foot Exam - Simple   Simple Foot Form Visual Inspection No deformities, no ulcerations, no other skin breakdown bilaterally: Yes Sensation Testing Intact to touch and monofilament testing bilaterally: Yes Pulse Check Posterior Tibialis and Dorsalis pulse intact bilaterally: Yes Comments     Results for orders placed or performed in visit on 09/04/23  Bayer DCA Hb A1c Waived   Collection Time: 09/04/23  9:22 AM  Result Value Ref Range   HB A1C (BAYER DCA - WAIVED) 7.1 (H) 4.8 - 5.6 %  Microalbumin, Urine Waived   Collection Time: 09/04/23  9:22 AM  Result Value Ref Range   Microalb, Ur Waived 80 (H) 0 - 19 mg/L   Creatinine, Urine Waived 200 10 - 300 mg/dL   Microalb/Creat Ratio 30-300 (H) <30 mg/g  Comprehensive metabolic panel   Collection Time: 09/04/23  9:22 AM  Result Value Ref Range   Glucose 140 (H) 70 - 99 mg/dL   BUN 20 6 - 24 mg/dL   Creatinine, Ser 8.77 0.76 - 1.27 mg/dL   eGFR 70 >40 fO/fpw/8.26   BUN/Creatinine Ratio 16 9 - 20   Sodium 143 134 - 144 mmol/L   Potassium 4.2 3.5 - 5.2 mmol/L   Chloride 104 96 - 106 mmol/L   CO2 22 20 - 29 mmol/L   Calcium  9.3 8.7 - 10.2 mg/dL   Total Protein 6.8 6.0 - 8.5 g/dL   Albumin 4.5 3.8 - 4.9 g/dL   Globulin, Total 2.3  1.5 - 4.5 g/dL   Bilirubin Total 0.3 0.0 - 1.2 mg/dL   Alkaline Phosphatase 100 44 - 121 IU/L   AST 18 0 - 40 IU/L   ALT 19 0 - 44 IU/L  Lipid Panel w/o Chol/HDL Ratio   Collection Time: 09/04/23  9:22 AM  Result Value Ref Range   Cholesterol, Total 98 (L) 100 - 199 mg/dL   Triglycerides 57 0 - 149 mg/dL   HDL 38 (L) >60 mg/dL   VLDL Cholesterol Cal 13 5 - 40 mg/dL   LDL Chol Calc (NIH) 47 0 - 99 mg/dL      Assessment & Plan:   Problem List Items Addressed This Visit       Cardiovascular and Mediastinum   Hypertension associated with diabetes (HCC)   Chronic, stable.  BP at goal.  Did not tolerate ARB in past, although would benefit from ACE or ARB with his diabetes and urine ALB 80 March 2025, consider low dose ACE trial in future, discussed with him.  Recommend he monitor BP at least a few mornings a week at home and document.  DASH diet at home.  Continue current medication regimen and adjust as needed.  He wishes to focus on diet and exercise  at this time.  Labs: CBC, TSH, CMP.         Relevant Medications   amLODipine  (NORVASC ) 10 MG tablet   metFORMIN  (GLUCOPHAGE -XR) 500 MG 24 hr tablet   rosuvastatin  (CRESTOR ) 10 MG tablet   Other Relevant Orders   Bayer DCA Hb A1c Waived   CBC with Differential/Platelet   Comprehensive metabolic panel with GFR   TSH     Endocrine   Type 2 diabetes mellitus with proteinuria (HCC) - Primary   Chronic, ongoing. A1c today 7.5%, trend up from previous 7.1% - initial diagnosis was 9.3%. Urine ALB 80 March 2025.  Did not tolerate ARB in past, could consider trial of low dose ACE in future, discussed with patient.  He would prefer not to add medication, recommend heavy focus on diet and exercise. - At this time will continue Metformin  XR 1000 MG BID. Would benefit Farxiga  in future for kidney health, but currently not covered. Could consider low dose Glipizide if needed in future, discussed with him. - Recommend he monitor BS 2-3 times daily  with goal fasting <130 and goal 2 hours post meal <180.  Document and bring to visits.   - Refuses vaccinations. - Eye and foot exams up to date. Return in 6 months.      Relevant Medications   metFORMIN  (GLUCOPHAGE -XR) 500 MG 24 hr tablet   rosuvastatin  (CRESTOR ) 10 MG tablet   Other Relevant Orders   Bayer DCA Hb A1c Waived   Hyperlipidemia associated with type 2 diabetes mellitus (HCC)   Chronic, ongoing.  Continue current medication regimen and adjust as needed.  Lipid panel today.      Relevant Medications   amLODipine  (NORVASC ) 10 MG tablet   metFORMIN  (GLUCOPHAGE -XR) 500 MG 24 hr tablet   rosuvastatin  (CRESTOR ) 10 MG tablet   Other Relevant Orders   Bayer DCA Hb A1c Waived   Comprehensive metabolic panel with GFR   Lipid Panel w/o Chol/HDL Ratio   Diabetes mellitus treated with oral medication (HCC)   Refer to diabetes with proteinuria plan of care.      Relevant Medications   metFORMIN  (GLUCOPHAGE -XR) 500 MG 24 hr tablet   rosuvastatin  (CRESTOR ) 10 MG tablet   Other Relevant Orders   Bayer DCA Hb A1c Waived     Other   BMI 27.0-27.9,adult   BMI 30.02, has gained 13 pounds since March, recommend working on loss.  Recommended eating smaller high protein, low fat meals more frequently and exercising 30 mins a day 5 times a week with a goal of 10-15lb weight loss in the next 3 months. Patient voiced their understanding and motivation to adhere to these recommendations.       Other Visit Diagnoses       Benign prostatic hyperplasia without lower urinary tract symptoms       PSA check on labs for annual check.   Relevant Orders   PSA     Acute non intractable tension-type headache       Continue at home regimen and we discussed supplements for tinnitus. No red flags.   Relevant Medications   amLODipine  (NORVASC ) 10 MG tablet     Screening for colon cancer       Cologuard ordered.   Relevant Orders   Cologuard        Follow up plan: Return in about 6 months  (around 09/08/2024) for T2DM, HTN/HLD.

## 2024-03-11 NOTE — Assessment & Plan Note (Addendum)
 Chronic, ongoing. A1c today 7.5%, trend up from previous 7.1% - initial diagnosis was 9.3%. Urine ALB 80 March 2025.  Did not tolerate ARB in past, could consider trial of low dose ACE in future, discussed with patient.  He would prefer not to add medication, recommend heavy focus on diet and exercise. - At this time will continue Metformin  XR 1000 MG BID. Would benefit Farxiga  in future for kidney health, but currently not covered. Could consider low dose Glipizide if needed in future, discussed with him. - Recommend he monitor BS 2-3 times daily with goal fasting <130 and goal 2 hours post meal <180.  Document and bring to visits.   - Refuses vaccinations. - Eye and foot exams up to date. Return in 6 months.

## 2024-03-11 NOTE — Assessment & Plan Note (Signed)
 Refer to diabetes with proteinuria plan of care.

## 2024-03-12 ENCOUNTER — Ambulatory Visit: Payer: Self-pay | Admitting: Nurse Practitioner

## 2024-03-12 LAB — CBC WITH DIFFERENTIAL/PLATELET
Basophils Absolute: 0.1 x10E3/uL (ref 0.0–0.2)
Basos: 1 %
EOS (ABSOLUTE): 0.1 x10E3/uL (ref 0.0–0.4)
Eos: 3 %
Hematocrit: 48.3 % (ref 37.5–51.0)
Hemoglobin: 15.6 g/dL (ref 13.0–17.7)
Immature Grans (Abs): 0 x10E3/uL (ref 0.0–0.1)
Immature Granulocytes: 0 %
Lymphocytes Absolute: 1.5 x10E3/uL (ref 0.7–3.1)
Lymphs: 29 %
MCH: 29.3 pg (ref 26.6–33.0)
MCHC: 32.3 g/dL (ref 31.5–35.7)
MCV: 91 fL (ref 79–97)
Monocytes Absolute: 0.6 x10E3/uL (ref 0.1–0.9)
Monocytes: 11 %
Neutrophils Absolute: 2.8 x10E3/uL (ref 1.4–7.0)
Neutrophils: 56 %
Platelets: 300 x10E3/uL (ref 150–450)
RBC: 5.32 x10E6/uL (ref 4.14–5.80)
RDW: 12.3 % (ref 11.6–15.4)
WBC: 5 x10E3/uL (ref 3.4–10.8)

## 2024-03-12 LAB — COMPREHENSIVE METABOLIC PANEL WITH GFR
ALT: 22 IU/L (ref 0–44)
AST: 20 IU/L (ref 0–40)
Albumin: 4.4 g/dL (ref 3.8–4.9)
Alkaline Phosphatase: 102 IU/L (ref 44–121)
BUN/Creatinine Ratio: 17 (ref 9–20)
BUN: 17 mg/dL (ref 6–24)
Bilirubin Total: 0.3 mg/dL (ref 0.0–1.2)
CO2: 21 mmol/L (ref 20–29)
Calcium: 9.3 mg/dL (ref 8.7–10.2)
Chloride: 103 mmol/L (ref 96–106)
Creatinine, Ser: 1.02 mg/dL (ref 0.76–1.27)
Globulin, Total: 2.4 g/dL (ref 1.5–4.5)
Glucose: 191 mg/dL — ABNORMAL HIGH (ref 70–99)
Potassium: 4.4 mmol/L (ref 3.5–5.2)
Sodium: 137 mmol/L (ref 134–144)
Total Protein: 6.8 g/dL (ref 6.0–8.5)
eGFR: 87 mL/min/1.73 (ref >59)

## 2024-03-12 LAB — PSA: Prostate Specific Ag, Serum: 0.7 ng/mL (ref 0.0–4.0)

## 2024-03-12 LAB — LIPID PANEL W/O CHOL/HDL RATIO
Cholesterol, Total: 124 mg/dL (ref 100–199)
HDL: 37 mg/dL — ABNORMAL LOW (ref >39)
LDL Chol Calc (NIH): 65 mg/dL (ref 0–99)
Triglycerides: 122 mg/dL (ref 0–149)
VLDL Cholesterol Cal: 22 mg/dL (ref 5–40)

## 2024-03-12 LAB — TSH: TSH: 1.76 u[IU]/mL (ref 0.450–4.500)

## 2024-03-12 NOTE — Progress Notes (Signed)
 Contacted via MyChart  Good morning Joseph Burch, your labs have returned and overall remain stable but sugar (glucose) is definitely elevated. Continue all current medications and focus heavily on diet and exercise.  Any questions? Keep being wonderful!!  Thank you for allowing me to participate in your care.  I appreciate you. Kindest regards, Omeed Osuna

## 2024-04-08 LAB — OPHTHALMOLOGY REPORT-SCANNED

## 2024-08-26 ENCOUNTER — Encounter: Payer: Self-pay | Admitting: Nurse Practitioner
